# Patient Record
Sex: Female | Born: 1990 | Hispanic: No | Marital: Single | Smoking: Never smoker
Health system: Southern US, Community
[De-identification: ages and names within clinical notes are randomized; demographics above are authoritative.]

## PROBLEM LIST (undated history)

## (undated) DIAGNOSIS — K649 Unspecified hemorrhoids: Secondary | ICD-10-CM

## (undated) DIAGNOSIS — J189 Pneumonia, unspecified organism: Secondary | ICD-10-CM

## (undated) DIAGNOSIS — R011 Cardiac murmur, unspecified: Secondary | ICD-10-CM

## (undated) DIAGNOSIS — J45909 Unspecified asthma, uncomplicated: Secondary | ICD-10-CM

---

## 2008-09-16 ENCOUNTER — Emergency Department (HOSPITAL_COMMUNITY): Admission: EM | Admit: 2008-09-16 | Discharge: 2008-09-16 | Payer: Self-pay | Admitting: Family Medicine

## 2008-09-16 ENCOUNTER — Encounter (INDEPENDENT_AMBULATORY_CARE_PROVIDER_SITE_OTHER): Payer: Self-pay | Admitting: *Deleted

## 2009-04-02 ENCOUNTER — Emergency Department (HOSPITAL_COMMUNITY): Admission: EM | Admit: 2009-04-02 | Discharge: 2009-04-02 | Payer: Self-pay | Admitting: Emergency Medicine

## 2009-05-31 ENCOUNTER — Emergency Department (HOSPITAL_COMMUNITY): Admission: EM | Admit: 2009-05-31 | Discharge: 2009-05-31 | Payer: Self-pay | Admitting: Emergency Medicine

## 2010-05-02 LAB — POCT URINALYSIS DIP (DEVICE)
Bilirubin Urine: NEGATIVE
Glucose, UA: NEGATIVE mg/dL
Nitrite: NEGATIVE

## 2010-05-02 LAB — POCT PREGNANCY, URINE: Preg Test, Ur: NEGATIVE

## 2010-06-27 ENCOUNTER — Emergency Department (HOSPITAL_COMMUNITY)
Admission: EM | Admit: 2010-06-27 | Discharge: 2010-06-27 | Disposition: A | Discharge status: Home / Self Care | Payer: PRIVATE HEALTH INSURANCE | Attending: Emergency Medicine | Admitting: Emergency Medicine

## 2010-06-27 DIAGNOSIS — IMO0002 Reserved for concepts with insufficient information to code with codable children: Secondary | ICD-10-CM | POA: Insufficient documentation

## 2010-06-27 DIAGNOSIS — R011 Cardiac murmur, unspecified: Secondary | ICD-10-CM | POA: Insufficient documentation

## 2010-06-27 DIAGNOSIS — Y9229 Other specified public building as the place of occurrence of the external cause: Secondary | ICD-10-CM | POA: Insufficient documentation

## 2010-06-27 DIAGNOSIS — J45909 Unspecified asthma, uncomplicated: Secondary | ICD-10-CM | POA: Insufficient documentation

## 2010-06-27 DIAGNOSIS — S0100XA Unspecified open wound of scalp, initial encounter: Secondary | ICD-10-CM | POA: Insufficient documentation

## 2018-05-26 HISTORY — PX: LIPOSUCTION: SHX10

## 2019-11-15 ENCOUNTER — Other Ambulatory Visit: Payer: Self-pay | Admitting: Obstetrics and Gynecology

## 2019-11-16 ENCOUNTER — Encounter (HOSPITAL_COMMUNITY): Payer: Self-pay

## 2019-11-16 ENCOUNTER — Encounter (HOSPITAL_COMMUNITY)
Admission: RE | Admit: 2019-11-16 | Discharge: 2019-11-16 | Disposition: A | Discharge status: Home / Self Care | Payer: BC Managed Care – PPO | Source: Ambulatory Visit | Attending: Obstetrics and Gynecology | Admitting: Obstetrics and Gynecology

## 2019-11-16 ENCOUNTER — Other Ambulatory Visit: Payer: Self-pay

## 2019-11-16 ENCOUNTER — Encounter (HOSPITAL_COMMUNITY): Payer: Self-pay | Admitting: Obstetrics and Gynecology

## 2019-11-16 DIAGNOSIS — D509 Iron deficiency anemia, unspecified: Secondary | ICD-10-CM

## 2019-11-16 DIAGNOSIS — Z01812 Encounter for preprocedural laboratory examination: Secondary | ICD-10-CM | POA: Insufficient documentation

## 2019-11-16 DIAGNOSIS — D259 Leiomyoma of uterus, unspecified: Secondary | ICD-10-CM

## 2019-11-16 DIAGNOSIS — N1330 Unspecified hydronephrosis: Secondary | ICD-10-CM

## 2019-11-16 HISTORY — DX: Leiomyoma of uterus, unspecified: D25.9

## 2019-11-16 HISTORY — DX: Cardiac murmur, unspecified: R01.1

## 2019-11-16 HISTORY — DX: Unspecified hemorrhoids: K64.9

## 2019-11-16 HISTORY — DX: Pneumonia, unspecified organism: J18.9

## 2019-11-16 HISTORY — DX: Iron deficiency anemia, unspecified: D50.9

## 2019-11-16 HISTORY — DX: Unspecified asthma, uncomplicated: J45.909

## 2019-11-16 HISTORY — DX: Unspecified hydronephrosis: N13.30

## 2019-11-16 LAB — CBC
HCT: 37.8 % (ref 36.0–46.0)
Hemoglobin: 11.9 g/dL — ABNORMAL LOW (ref 12.0–15.0)
MCH: 27 pg (ref 26.0–34.0)
MCHC: 31.5 g/dL (ref 30.0–36.0)
MCV: 85.9 fL (ref 80.0–100.0)
Platelets: 348 10*3/uL (ref 150–400)
RBC: 4.4 MIL/uL (ref 3.87–5.11)
RDW: 16.5 % — ABNORMAL HIGH (ref 11.5–15.5)
WBC: 3.2 10*3/uL — ABNORMAL LOW (ref 4.0–10.5)
nRBC: 0 % (ref 0.0–0.2)

## 2019-11-16 LAB — TYPE AND SCREEN
ABO/RH(D): O POS
Antibody Screen: NEGATIVE

## 2019-11-16 NOTE — Pre-Procedure Instructions (Signed)
Dezirea Hawkins  11/16/2019    Your procedure is scheduled on Friday, November 23, 2019 at 1:30 PM.   Report to [facility] Entrance "A" Admitting Office at 11:30 AM.   Call this number if you have problems the morning of surgery: [phone]   Questions prior to day of surgery, please call [phone] between 8 & 4 PM.   Remember:  Do not eat food after midnight Thursday, 11/22/19.  You may drink clear liquids until 10:30 AM.  Clear liquids allowed are:  Water, Juice (non-citric and without pulp - diabetics please choose diet or no sugar options), Carbonated beverages - (diabetics please choose diet or no sugar options), Clear Tea, Black Coffee only (no creamer, milk or cream including half and half), Plain Jell-O only (diabetics please choose diet or no sugar options) and Gatorade (diabetics please choose diet or no sugar options)    Take these medicines the morning of surgery with A SIP OF WATER: Levocetirizine (Xyzal) - if needed, Albuterol inhaler (Ventolin) - if needed (bring inhaler with you day of surgery)  Stop Vitamins as of today prior to surgery. Do not use Aspirin containing products, NSAIDS (Ibuprofen, Aleve, etc), Herbal medications or Fish Oil prior to surgery.    Do not wear jewelry, make-up or nail polish.  Do not wear lotions, powders, perfumes or deodorant.  Do not shave 48 hours prior to surgery.    Do not bring valuables to the hospital.  [facility] is not responsible for any belongings or valuables.  Contacts, dentures or bridgework may not be worn into surgery.  Leave your suitcase in the car.  After surgery it may be brought to your room.  For patients admitted to the hospital, discharge time will be determined by your treatment team.  [facility] - Preparing for Surgery  Before surgery, you can play an important role.  Because skin is not sterile, your skin needs to be as free of germs as possible.  You can reduce the number of germs on you skin  by washing with CHG (chlorahexidine gluconate) soap before surgery.  CHG is an antiseptic cleaner which kills germs and bonds with the skin to continue killing germs even after washing.  Oral Hygiene is also important in reducing the risk of infection.  Remember to brush your teeth with your regular toothpaste the morning of surgery.  Please DO NOT use if you have an allergy to CHG or antibacterial soaps.  If your skin becomes reddened/irritated stop using the CHG and inform your nurse when you arrive at Short Stay.  Do not shave (including legs and underarms) for at least 48 hours prior to the first CHG shower.  You may shave your face.  Please follow these instructions carefully:   1.  Shower with CHG Soap the night before surgery and the morning of Surgery.  2.  If you choose to wash your hair, wash your hair first as usual with your normal shampoo.  3.  After you shampoo, rinse your hair and body thoroughly to remove the shampoo. 4.  Use CHG as you would any other liquid soap.  You can apply chg directly to the skin and wash gently with a      scrungie or washcloth.           5.  Apply the CHG Soap to your body ONLY FROM THE NECK DOWN.   Do not use on open wounds or open sores. Avoid contact with your eyes, ears, mouth  and genitals (private parts).  Wash genitals (private parts) with your normal soap - do this prior to using the CHG soap.  6.  Wash thoroughly, paying special attention to the area where your surgery will be performed.  7.  Thoroughly rinse your body with warm water from the neck down.  8.  DO NOT shower/wash with your normal soap after using and rinsing off the CHG Soap.  9.  Pat yourself dry with a clean towel.            10.  Wear clean pajamas.            11.  Place clean sheets on your bed the night of your first shower and do not sleep with pets.  Day of Surgery  Shower as above. Do not apply any lotions/deodorants the morning of surgery.   Please wear clean clothes  to the hospital. Remember to brush your teeth with toothpaste.  Please read over the fact sheets that you were given.

## 2019-11-16 NOTE — H&P (Addendum)
Alicia Hawkins is an 29 y.o. female. G0 BF presents for exp lap, myomectomy due to symptomatic uterine fibroids. Pt was found to have hydronephrosis on sonogram along with large uterine fibroids. She complains of heavy cycles. (+) IDA  Pertinent Gynecological History: Menses: flow is excessive with use of 5 pads or tampons on heaviest days Bleeding: menorrhagia Contraception: condoms DES exposure: denies Blood transfusions: none Sexually transmitted diseases: none Previous GYN Procedures: liposuction  Last mammogram: n/a Date:n/a Last pap: normal Date:2021 OB History: G0   Menstrual History: Menarche age: n/a No LMP recorded.    PMHx: IDA Uterine fibroid  Surgical hx;  liposuction  FH: noncontributory  Social History: single, nonsmoker, employed  Allergies: No Known Allergies  No latex allergy  No medications prior to admission.    Review of Systems  All other systems reviewed and are negative.   There were no vitals taken for this visit. Physical Exam Constitutional:      Appearance: Normal appearance.  HENT:     Head: Normocephalic.     Mouth/Throat:     Pharynx: Oropharynx is clear.  Eyes:     Extraocular Movements: Extraocular movements intact.  Cardiovascular:     Rate and Rhythm: Normal rate.  Pulmonary:     Breath sounds: Normal breath sounds.  Abdominal:     Palpations: Abdomen is soft.     Tenderness: There is no right CVA tenderness or left CVA tenderness.    Genitourinary:    General: Normal vulva.     Cervix: Normal.     Comments: Uterus 16 wk size with palp left mobile large 10 cm fibroid Musculoskeletal:        General: Normal range of motion.     Cervical back: Neck supple.  Skin:    General: Skin is dry.  Neurological:     Mental Status: She is alert.  Psychiatric:        Mood and Affect: Mood normal.        Behavior: Behavior normal.     No results found for this or any previous visit (from the past 24 hour(s)).  No  results found.  Assessment/Plan: Uterine fibroids IDA Hydronephrosis on sonogram P) exp lap, myomectomy. Risk of surgery reviewed including infection, bleeding, poss need for blood transfusion and its risk( HIV, acute rxn, hepatitis), internal scar tissue, injury to surrounding organ structures, poss need for C/S in the future, up to 30% chance of needing same procedure in the future. All ? answered  Derrico Zhong A [name] 11/16/2019, 6:43 AM   Addendum 10/29 @ 1:35 pm;: I have reexamined pt. No change since last visit. Currently spotting from cycle

## 2019-11-16 NOTE — Progress Notes (Signed)
PCP - Virginia Rochester, PA   ERAS Protcol - no drink   COVID TEST- scheduled for 11/21/19   Anesthesia review: No  Patient denies shortness of breath, fever, cough and chest pain at PAT appointment   All instructions explained to the patient, with a verbal understanding of the material. Patient agrees to go over the instructions while at home for a better understanding. Patient also instructed to self quarantine after being tested for COVID-19. The opportunity to ask questions was provided.

## 2019-11-21 ENCOUNTER — Other Ambulatory Visit (HOSPITAL_COMMUNITY)
Admission: RE | Admit: 2019-11-21 | Discharge: 2019-11-21 | Disposition: A | Discharge status: Home / Self Care | Payer: BC Managed Care – PPO | Source: Ambulatory Visit | Attending: Obstetrics and Gynecology | Admitting: Obstetrics and Gynecology

## 2019-11-21 DIAGNOSIS — Z01818 Encounter for other preprocedural examination: Secondary | ICD-10-CM | POA: Insufficient documentation

## 2019-11-21 DIAGNOSIS — Z20822 Contact with and (suspected) exposure to covid-19: Secondary | ICD-10-CM | POA: Insufficient documentation

## 2019-11-21 LAB — SARS CORONAVIRUS 2 (TAT 6-24 HRS): SARS Coronavirus 2: NEGATIVE

## 2019-11-23 ENCOUNTER — Encounter (HOSPITAL_COMMUNITY): Payer: Self-pay | Admitting: Obstetrics and Gynecology

## 2019-11-23 ENCOUNTER — Encounter (HOSPITAL_COMMUNITY)
Admission: RE | Disposition: A | Discharge status: Home / Self Care | Payer: Self-pay | Source: Home / Self Care | Attending: Obstetrics and Gynecology

## 2019-11-23 ENCOUNTER — Inpatient Hospital Stay (HOSPITAL_COMMUNITY)
Admission: RE | Admit: 2019-11-23 | Discharge: 2019-11-26 | DRG: 742 | Disposition: A | Discharge status: Home / Self Care | Payer: BC Managed Care – PPO | Attending: Obstetrics and Gynecology | Admitting: Obstetrics and Gynecology

## 2019-11-23 ENCOUNTER — Other Ambulatory Visit: Payer: Self-pay

## 2019-11-23 ENCOUNTER — Inpatient Hospital Stay (HOSPITAL_COMMUNITY): Payer: BC Managed Care – PPO | Admitting: Anesthesiology

## 2019-11-23 ENCOUNTER — Inpatient Hospital Stay (HOSPITAL_COMMUNITY): Payer: BC Managed Care – PPO | Admitting: Vascular Surgery

## 2019-11-23 DIAGNOSIS — D509 Iron deficiency anemia, unspecified: Secondary | ICD-10-CM | POA: Diagnosis present

## 2019-11-23 DIAGNOSIS — Z20822 Contact with and (suspected) exposure to covid-19: Secondary | ICD-10-CM | POA: Diagnosis present

## 2019-11-23 DIAGNOSIS — D219 Benign neoplasm of connective and other soft tissue, unspecified: Secondary | ICD-10-CM | POA: Diagnosis present

## 2019-11-23 DIAGNOSIS — D259 Leiomyoma of uterus, unspecified: Secondary | ICD-10-CM | POA: Diagnosis present

## 2019-11-23 DIAGNOSIS — Z9889 Other specified postprocedural states: Secondary | ICD-10-CM

## 2019-11-23 DIAGNOSIS — N1330 Unspecified hydronephrosis: Secondary | ICD-10-CM | POA: Diagnosis present

## 2019-11-23 HISTORY — PX: MYOMECTOMY: SHX85

## 2019-11-23 LAB — POCT PREGNANCY, URINE: Preg Test, Ur: NEGATIVE

## 2019-11-23 LAB — ABO/RH: ABO/RH(D): O POS

## 2019-11-23 SURGERY — MYOMECTOMY, ABDOMINAL APPROACH
Anesthesia: General

## 2019-11-23 MED ORDER — SIMETHICONE 80 MG PO CHEW: 80.0000 mg | CHEWABLE_TABLET | Freq: Four times a day (QID) | ORAL | Status: DC | PRN

## 2019-11-23 MED ORDER — VASOPRESSIN 20 UNIT/ML IV SOLN
INTRAVENOUS | Status: AC
  Filled 2019-11-23: qty 3

## 2019-11-23 MED ORDER — DIPHENHYDRAMINE HCL 50 MG/ML IJ SOLN: 12.5000 mg | Freq: Four times a day (QID) | INTRAMUSCULAR | Status: DC | PRN

## 2019-11-23 MED ORDER — ACETAMINOPHEN 10 MG/ML IV SOLN
INTRAVENOUS | Status: AC
  Filled 2019-11-23: qty 100

## 2019-11-23 MED ORDER — PANTOPRAZOLE SODIUM 40 MG PO TBEC
40.0000 mg | DELAYED_RELEASE_TABLET | Freq: Every day | ORAL | Status: DC
  Administered 2019-11-24 – 2019-11-26 (×3): 40 mg via ORAL
  Filled 2019-11-23 (×3): qty 1

## 2019-11-23 MED ORDER — ACETAMINOPHEN 10 MG/ML IV SOLN
INTRAVENOUS | Status: DC | PRN
  Administered 2019-11-23: 1000 mg via INTRAVENOUS

## 2019-11-23 MED ORDER — ALBUTEROL SULFATE HFA 108 (90 BASE) MCG/ACT IN AERS
2.0000 | INHALATION_SPRAY | Freq: Four times a day (QID) | RESPIRATORY_TRACT | Status: DC | PRN
  Filled 2019-11-23: qty 6.7

## 2019-11-23 MED ORDER — DEXAMETHASONE SODIUM PHOSPHATE 10 MG/ML IJ SOLN
INTRAMUSCULAR | Status: DC | PRN
  Administered 2019-11-23: 10 mg via INTRAVENOUS

## 2019-11-23 MED ORDER — VASOPRESSIN 20 UNIT/ML IV SOLN
INTRAVENOUS | Status: DC | PRN
  Administered 2019-11-23: 44 mL via INTRAMUSCULAR
  Administered 2019-11-23: 10 mL via INTRAMUSCULAR

## 2019-11-23 MED ORDER — ONDANSETRON HCL 4 MG/2ML IJ SOLN
INTRAMUSCULAR | Status: DC | PRN
  Administered 2019-11-23: 4 mg via INTRAVENOUS

## 2019-11-23 MED ORDER — POVIDONE-IODINE 10 % EX SWAB: 2.0000 "application " | Freq: Once | CUTANEOUS | Status: DC

## 2019-11-23 MED ORDER — OXYCODONE HCL 5 MG PO TABS: 5.0000 mg | ORAL_TABLET | Freq: Once | ORAL | Status: DC | PRN

## 2019-11-23 MED ORDER — FENTANYL CITRATE (PF) 250 MCG/5ML IJ SOLN
INTRAMUSCULAR | Status: DC | PRN
  Administered 2019-11-23: 50 ug via INTRAVENOUS
  Administered 2019-11-23: 100 ug via INTRAVENOUS
  Administered 2019-11-23: 50 ug via INTRAVENOUS

## 2019-11-23 MED ORDER — BUPIVACAINE HCL (PF) 0.25 % IJ SOLN
INTRAMUSCULAR | Status: AC
  Filled 2019-11-23: qty 30

## 2019-11-23 MED ORDER — SENNOSIDES-DOCUSATE SODIUM 8.6-50 MG PO TABS
1.0000 | ORAL_TABLET | Freq: Every evening | ORAL | Status: DC | PRN
  Administered 2019-11-24: 1 via ORAL
  Filled 2019-11-23: qty 1

## 2019-11-23 MED ORDER — OXYCODONE HCL 5 MG/5ML PO SOLN: 5.0000 mg | Freq: Once | ORAL | Status: DC | PRN

## 2019-11-23 MED ORDER — HYDROMORPHONE 1 MG/ML IV SOLN
INTRAVENOUS | Status: DC
  Administered 2019-11-23: 0.9 mg via INTRAVENOUS
  Administered 2019-11-23: 2.2 mg via INTRAVENOUS
  Administered 2019-11-23: 30 mg via INTRAVENOUS
  Administered 2019-11-24: 2.2 mg via INTRAVENOUS

## 2019-11-23 MED ORDER — OXYCODONE HCL 5 MG PO TABS
5.0000 mg | ORAL_TABLET | ORAL | Status: DC | PRN
  Administered 2019-11-24: 10 mg via ORAL
  Administered 2019-11-24: 5 mg via ORAL
  Administered 2019-11-24 – 2019-11-26 (×8): 10 mg via ORAL
  Filled 2019-11-23: qty 1
  Filled 2019-11-23 (×2): qty 2
  Filled 2019-11-23: qty 1
  Filled 2019-11-23 (×7): qty 2

## 2019-11-23 MED ORDER — SODIUM CHLORIDE 0.9% FLUSH: 9.0000 mL | INTRAVENOUS | Status: DC | PRN

## 2019-11-23 MED ORDER — SUGAMMADEX SODIUM 200 MG/2ML IV SOLN
INTRAVENOUS | Status: DC | PRN
  Administered 2019-11-23: 120 mg via INTRAVENOUS

## 2019-11-23 MED ORDER — DEXTROSE IN LACTATED RINGERS 5 % IV SOLN: INTRAVENOUS | Status: DC

## 2019-11-23 MED ORDER — KETOROLAC TROMETHAMINE 30 MG/ML IJ SOLN
30.0000 mg | Freq: Once | INTRAMUSCULAR | Status: AC | PRN
  Administered 2019-11-23: 30 mg via INTRAVENOUS

## 2019-11-23 MED ORDER — KETOROLAC TROMETHAMINE 30 MG/ML IJ SOLN
INTRAMUSCULAR | Status: AC
  Filled 2019-11-23: qty 1

## 2019-11-23 MED ORDER — DIPHENHYDRAMINE HCL 12.5 MG/5ML PO ELIX: 12.5000 mg | ORAL_SOLUTION | Freq: Four times a day (QID) | ORAL | Status: DC | PRN

## 2019-11-23 MED ORDER — CHLORHEXIDINE GLUCONATE 0.12 % MT SOLN
OROMUCOSAL | Status: AC
  Administered 2019-11-23: 15 mL via OROMUCOSAL
  Filled 2019-11-23: qty 15

## 2019-11-23 MED ORDER — BUPIVACAINE HCL (PF) 0.25 % IJ SOLN
INTRAMUSCULAR | Status: DC | PRN
  Administered 2019-11-23: 9 mL

## 2019-11-23 MED ORDER — ONDANSETRON HCL 4 MG PO TABS: 4.0000 mg | ORAL_TABLET | Freq: Four times a day (QID) | ORAL | Status: DC | PRN

## 2019-11-23 MED ORDER — CEFAZOLIN SODIUM-DEXTROSE 2-4 GM/100ML-% IV SOLN
2.0000 g | INTRAVENOUS | Status: AC
  Administered 2019-11-23: 2 g via INTRAVENOUS

## 2019-11-23 MED ORDER — MIDAZOLAM HCL 2 MG/2ML IJ SOLN
INTRAMUSCULAR | Status: DC | PRN
  Administered 2019-11-23: 2 mg via INTRAVENOUS

## 2019-11-23 MED ORDER — ONDANSETRON HCL 4 MG/2ML IJ SOLN
4.0000 mg | Freq: Four times a day (QID) | INTRAMUSCULAR | Status: DC | PRN
  Administered 2019-11-25: 4 mg via INTRAVENOUS
  Filled 2019-11-23 (×2): qty 2

## 2019-11-23 MED ORDER — MENTHOL 3 MG MT LOZG: 1.0000 | LOZENGE | OROMUCOSAL | Status: DC | PRN

## 2019-11-23 MED ORDER — HYDROMORPHONE 1 MG/ML IV SOLN
INTRAVENOUS | Status: AC
  Filled 2019-11-23: qty 30

## 2019-11-23 MED ORDER — MIDAZOLAM HCL 2 MG/2ML IJ SOLN
INTRAMUSCULAR | Status: AC
  Filled 2019-11-23: qty 2

## 2019-11-23 MED ORDER — SCOPOLAMINE 1 MG/3DAYS TD PT72
MEDICATED_PATCH | TRANSDERMAL | Status: DC | PRN
  Administered 2019-11-23: 1 via TRANSDERMAL

## 2019-11-23 MED ORDER — NALOXONE HCL 0.4 MG/ML IJ SOLN: 0.4000 mg | INTRAMUSCULAR | Status: DC | PRN

## 2019-11-23 MED ORDER — PROMETHAZINE HCL 25 MG/ML IJ SOLN: 6.2500 mg | INTRAMUSCULAR | Status: DC | PRN

## 2019-11-23 MED ORDER — FENTANYL CITRATE (PF) 250 MCG/5ML IJ SOLN
INTRAMUSCULAR | Status: AC
  Filled 2019-11-23: qty 5

## 2019-11-23 MED ORDER — SODIUM CHLORIDE 0.9 % IR SOLN
Status: DC | PRN
  Administered 2019-11-23: 2000 mL

## 2019-11-23 MED ORDER — HYDROMORPHONE HCL 1 MG/ML IJ SOLN: 0.2500 mg | INTRAMUSCULAR | Status: DC | PRN

## 2019-11-23 MED ORDER — ROCURONIUM BROMIDE 10 MG/ML (PF) SYRINGE
PREFILLED_SYRINGE | INTRAVENOUS | Status: DC | PRN
  Administered 2019-11-23: 70 mg via INTRAVENOUS
  Administered 2019-11-23 (×2): 20 mg via INTRAVENOUS

## 2019-11-23 MED ORDER — LIDOCAINE 2% (20 MG/ML) 5 ML SYRINGE
INTRAMUSCULAR | Status: DC | PRN
  Administered 2019-11-23: 100 mg via INTRAVENOUS

## 2019-11-23 MED ORDER — ALBUMIN HUMAN 5 % IV SOLN: INTRAVENOUS | Status: DC | PRN

## 2019-11-23 MED ORDER — PHENYLEPHRINE 40 MCG/ML (10ML) SYRINGE FOR IV PUSH (FOR BLOOD PRESSURE SUPPORT)
PREFILLED_SYRINGE | INTRAVENOUS | Status: DC | PRN
  Administered 2019-11-23: 80 ug via INTRAVENOUS
  Administered 2019-11-23: 120 ug via INTRAVENOUS
  Administered 2019-11-23: 80 ug via INTRAVENOUS

## 2019-11-23 MED ORDER — CEFAZOLIN SODIUM-DEXTROSE 2-4 GM/100ML-% IV SOLN
INTRAVENOUS | Status: AC
  Filled 2019-11-23: qty 100

## 2019-11-23 MED ORDER — ORAL CARE MOUTH RINSE: 15.0000 mL | Freq: Once | OROMUCOSAL | Status: AC

## 2019-11-23 MED ORDER — PROPOFOL 10 MG/ML IV BOLUS
INTRAVENOUS | Status: DC | PRN
  Administered 2019-11-23: 150 mg via INTRAVENOUS

## 2019-11-23 MED ORDER — CHLORHEXIDINE GLUCONATE 0.12 % MT SOLN: 15.0000 mL | Freq: Once | OROMUCOSAL | Status: AC

## 2019-11-23 MED ORDER — ONDANSETRON HCL 4 MG/2ML IJ SOLN
4.0000 mg | Freq: Four times a day (QID) | INTRAMUSCULAR | Status: DC | PRN
  Administered 2019-11-24: 4 mg via INTRAVENOUS

## 2019-11-23 MED ORDER — LACTATED RINGERS IV SOLN: INTRAVENOUS | Status: DC

## 2019-11-23 SURGICAL SUPPLY — 48 items
BARRIER ADHS 3X4 INTERCEED (GAUZE/BANDAGES/DRESSINGS) ×9 IMPLANT
BENZOIN TINCTURE PRP APPL 2/3 (GAUZE/BANDAGES/DRESSINGS) ×3 IMPLANT
CANISTER SUCT 3000ML PPV (MISCELLANEOUS) ×3 IMPLANT
CONT SPEC PATH 64OZ SNAP LID (MISCELLANEOUS) ×3 IMPLANT
DECANTER SPIKE VIAL GLASS SM (MISCELLANEOUS) ×3 IMPLANT
DRAPE CESAREAN BIRTH W POUCH (DRAPES) ×3 IMPLANT
DRSG OPSITE POSTOP 4X10 (GAUZE/BANDAGES/DRESSINGS) ×3 IMPLANT
DURAPREP 26ML APPLICATOR (WOUND CARE) ×3 IMPLANT
ELECT NEEDLE TIP 2.8 STRL (NEEDLE) ×3 IMPLANT
GAUZE 4X4 16PLY RFD (DISPOSABLE) IMPLANT
GAUZE SPONGE 4X4 12PLY STRL LF (GAUZE/BANDAGES/DRESSINGS) ×3 IMPLANT
GLOVE BIOGEL PI IND STRL 7.0 (GLOVE) ×3 IMPLANT
GLOVE BIOGEL PI INDICATOR 7.0 (GLOVE) ×6
GLOVE ECLIPSE 6.5 STRL STRAW (GLOVE) ×3 IMPLANT
GOWN STRL REUS W/ TWL LRG LVL3 (GOWN DISPOSABLE) ×3 IMPLANT
GOWN STRL REUS W/TWL LRG LVL3 (GOWN DISPOSABLE) ×6
KIT TURNOVER KIT B (KITS) ×3 IMPLANT
NEEDLE HYPO 22GX1.5 SAFETY (NEEDLE) ×3 IMPLANT
NEEDLE SPNL 22GX3.5 QUINCKE BK (NEEDLE) ×3 IMPLANT
NS IRRIG 1000ML POUR BTL (IV SOLUTION) ×3 IMPLANT
PACK ABDOMINAL GYN (CUSTOM PROCEDURE TRAY) ×3 IMPLANT
PAD ARMBOARD 7.5X6 YLW CONV (MISCELLANEOUS) ×3 IMPLANT
PAD OB MATERNITY 4.3X12.25 (PERSONAL CARE ITEMS) ×3 IMPLANT
PENCIL SMOKE EVACUATOR (MISCELLANEOUS) ×3 IMPLANT
RTRCTR C-SECT PINK 25CM LRG (MISCELLANEOUS) ×3 IMPLANT
SPECIMEN JAR MEDIUM (MISCELLANEOUS) ×3 IMPLANT
SPONGE LAP 18X18 X RAY DECT (DISPOSABLE) ×18 IMPLANT
STAPLER VISISTAT 35W (STAPLE) IMPLANT
SUT MON AB 2-0 SH 27 (SUTURE) ×2
SUT MON AB 2-0 SH27 (SUTURE) ×1 IMPLANT
SUT MON AB 3-0 SH 27 (SUTURE) ×6
SUT MON AB 3-0 SH27 (SUTURE) ×3 IMPLANT
SUT MON AB 4-0 PS1 27 (SUTURE) ×3 IMPLANT
SUT PLAIN 2 0 XLH (SUTURE) IMPLANT
SUT VIC AB 0 CT1 18XCR BRD 8 (SUTURE) ×3 IMPLANT
SUT VIC AB 0 CT1 18XCR BRD8 (SUTURE) ×1 IMPLANT
SUT VIC AB 0 CT1 36 (SUTURE) ×6 IMPLANT
SUT VIC AB 0 CT1 8-18 (SUTURE) ×8
SUT VIC AB 2-0 CT1 27 (SUTURE) ×2
SUT VIC AB 2-0 CT1 TAPERPNT 27 (SUTURE) ×1 IMPLANT
SUT VIC AB 2-0 SH 27 (SUTURE)
SUT VIC AB 2-0 SH 27XBRD (SUTURE) IMPLANT
SUT VIC AB 3-0 SH 8-18 (SUTURE) ×3 IMPLANT
SUT VICRYL 4-0 PS2 18IN ABS (SUTURE) IMPLANT
SYR CONTROL 10ML LL (SYRINGE) ×6 IMPLANT
TAPE STRIPS DRAPE STRL (GAUZE/BANDAGES/DRESSINGS) ×3 IMPLANT
TOWEL GREEN STERILE FF (TOWEL DISPOSABLE) ×6 IMPLANT
TRAY FOLEY W/BAG SLVR 14FR (SET/KITS/TRAYS/PACK) ×3 IMPLANT

## 2019-11-23 NOTE — Anesthesia Postprocedure Evaluation (Signed)
Anesthesia Post Note  Patient: Alicia Hawkins  Procedure(s) Performed: ABDOMINAL MYOMECTOMY (N/A )     Patient location during evaluation: PACU Anesthesia Type: General Level of consciousness: awake Pain management: pain level controlled Vital Signs Assessment: post-procedure vital signs reviewed and stable Respiratory status: spontaneous breathing, nonlabored ventilation, respiratory function stable and patient connected to nasal cannula oxygen Cardiovascular status: blood pressure returned to baseline and stable Postop Assessment: no apparent nausea or vomiting Anesthetic complications: yes   Encounter Complications  Complication Outcome Phase Comment  Vomiting  Intraprocedure patient vomited after extubation, immediately placed on side and suuctioned.    Last Vitals:  Vitals:   11/23/19 1851 11/23/19 1913  BP: 110/63   Pulse: 84   Resp: 18 17  Temp: 36.6 C   SpO2:  97%    Last Pain:  Vitals:   11/23/19 1913  TempSrc:   PainSc: 3                  Daegon Deiss P Katalena Malveaux

## 2019-11-23 NOTE — Anesthesia Preprocedure Evaluation (Signed)
Anesthesia Evaluation  Patient identified by MRN, date of birth, ID band Patient awake    Reviewed: Allergy & Precautions, NPO status , Patient's Chart, lab work & pertinent test results  Airway Mallampati: II  TM Distance: >3 FB Neck ROM: Full    Dental no notable dental hx.    Pulmonary asthma ,    Pulmonary exam normal breath sounds clear to auscultation       Cardiovascular negative cardio ROS Normal cardiovascular exam Rhythm:Regular Rate:Normal     Neuro/Psych negative neurological ROS  negative psych ROS   GI/Hepatic negative GI ROS, Neg liver ROS,   Endo/Other  negative endocrine ROS  Renal/GU negative Renal ROS  negative genitourinary   Musculoskeletal negative musculoskeletal ROS (+)   Abdominal   Peds negative pediatric ROS (+)  Hematology  (+) anemia ,   Anesthesia Other Findings   Reproductive/Obstetrics negative OB ROS                             Anesthesia Physical Anesthesia Plan  ASA: II  Anesthesia Plan: General   Post-op Pain Management:    Induction: Intravenous  PONV Risk Score and Plan: 4 or greater and Ondansetron, Dexamethasone, Treatment may vary due to age or medical condition, Scopolamine patch - Pre-op and Midazolam  Airway Management Planned: Oral ETT  Additional Equipment:   Intra-op Plan:   Post-operative Plan: Extubation in OR  Informed Consent: I have reviewed the patients History and Physical, chart, labs and discussed the procedure including the risks, benefits and alternatives for the proposed anesthesia with the patient or authorized representative who has indicated his/her understanding and acceptance.     Dental advisory given  Plan Discussed with: CRNA and Surgeon  Anesthesia Plan Comments:         Anesthesia Quick Evaluation

## 2019-11-23 NOTE — Progress Notes (Signed)
Notified Dr. Kalman Shan of pt's piercing in the right ear. Pt. Cannot get it out.

## 2019-11-23 NOTE — Anesthesia Procedure Notes (Signed)
Procedure Name: Intubation Date/Time: 11/23/2019 1:54 PM Performed by: Bryson Corona, CRNA Pre-anesthesia Checklist: Patient identified, Emergency Drugs available, Suction available and Patient being monitored Patient Re-evaluated:Patient Re-evaluated prior to induction Oxygen Delivery Method: Circle System Utilized Preoxygenation: Pre-oxygenation with 100% oxygen Induction Type: IV induction Ventilation: Mask ventilation without difficulty Laryngoscope Size: Mac and 3 Grade View: Grade I Tube type: Oral Tube size: 7.0 mm Number of attempts: 1 Airway Equipment and Method: Stylet Placement Confirmation: ETT inserted through vocal cords under direct vision,  positive ETCO2 and breath sounds checked- equal and bilateral Secured at: 21 cm Tube secured with: Tape Dental Injury: Teeth and Oropharynx as per pre-operative assessment

## 2019-11-23 NOTE — Transfer of Care (Signed)
Immediate Anesthesia Transfer of Care Note  Patient: Alicia Hawkins  Procedure(s) Performed: ABDOMINAL MYOMECTOMY (N/A )  Patient Location: PACU  Anesthesia Type:General  Level of Consciousness: lethargic and responds to stimulation  Airway & Oxygen Therapy: Patient Spontanous Breathing  Post-op Assessment: Report given to RN  Post vital signs: Reviewed and stable  Last Vitals:  Vitals Value Taken Time  BP 109/74 11/23/19 1717  Temp    Pulse 92 11/23/19 1719  Resp 22 11/23/19 1719  SpO2 100 % 11/23/19 1719  Vitals shown include unvalidated device data.  Last Pain:  Vitals:   11/23/19 1230  TempSrc:   PainSc: 0-No pain      Patients Stated Pain Goal: 2 (67/67/20 9470)  Complications: No complications documented.

## 2019-11-23 NOTE — Brief Op Note (Signed)
11/23/2019  5:17 PM  PATIENT:  Alicia Hawkins  29 y.o. female  PRE-OPERATIVE DIAGNOSIS:  symptomatic  uterine fibroid( menorrhagia, IDA, IM/SS fibroid)  POST-OPERATIVE DIAGNOSIS:  IDA, menorrhagia, IM/SS/SM fibroids  PROCEDURE:  exp lap, multiple myomectomy  SURGEON:  Surgeon(s) and Role:    * Derica Leiber, Alanda Slim, MD - Primary  PHYSICIAN ASSISTANT:   ASSISTANTS: Gaylord Shih RNFA   ANESTHESIA:   general Findings: multiple SM fibroids, large anterior pedunculated fibroid, left anterior IM/SS fibroid, posterior fundal fibroid SS x 3 notes, multiple intramural fibroids, nl tubes and ovaries, focal posterior cul de sac nodular endometriotic implant, nl ovaries, nl upper palp liver edge  EBL:  200 mL   BLOOD ADMINISTERED:none  DRAINS: none   LOCAL MEDICATIONS USED:  none  SPECIMEN:  Source of Specimen:  myoma x 18 ( 1186 g)  DISPOSITION OF SPECIMEN:  PATHOLOGY  COUNTS:  YES  TOURNIQUET:  * No tourniquets in log *  DICTATION: .Other Dictation: Dictation Number [id number]  PLAN OF CARE: Admit to inpatient   PATIENT DISPOSITION:  PACU - hemodynamically stable.   Delay start of Pharmacological VTE agent (>24hrs) due to surgical blood loss or risk of bleeding: no

## 2019-11-23 NOTE — OR Nursing (Addendum)
Patient left plastic spacer in belly button. Spacer came out prior to surgery. Patient also had earring in ear that she could not remove. Patient was made aware of possible injury however she was not able to remove. Earring was taped prior to going asleep.

## 2019-11-24 ENCOUNTER — Encounter (HOSPITAL_COMMUNITY): Payer: Self-pay | Admitting: Obstetrics and Gynecology

## 2019-11-24 LAB — CBC
HCT: 24.7 % — ABNORMAL LOW (ref 36.0–46.0)
Hemoglobin: 7.8 g/dL — ABNORMAL LOW (ref 12.0–15.0)
MCH: 26.6 pg (ref 26.0–34.0)
MCHC: 31.6 g/dL (ref 30.0–36.0)
MCV: 84.3 fL (ref 80.0–100.0)
Platelets: 303 10*3/uL (ref 150–400)
RBC: 2.93 MIL/uL — ABNORMAL LOW (ref 3.87–5.11)
RDW: 15.9 % — ABNORMAL HIGH (ref 11.5–15.5)
WBC: 12 10*3/uL — ABNORMAL HIGH (ref 4.0–10.5)
nRBC: 0 % (ref 0.0–0.2)

## 2019-11-24 MED ORDER — SODIUM CHLORIDE 0.9 % IV SOLN
25.0000 mg | Freq: Once | INTRAVENOUS | Status: AC
  Administered 2019-11-24: 25 mg via INTRAVENOUS
  Filled 2019-11-24: qty 0.5

## 2019-11-24 MED ORDER — SODIUM CHLORIDE 0.9 % IV SOLN
1000.0000 mg | Freq: Once | INTRAVENOUS | Status: AC
  Administered 2019-11-24: 1000 mg via INTRAVENOUS
  Filled 2019-11-24: qty 20

## 2019-11-24 MED ORDER — POLYSACCHARIDE IRON COMPLEX 150 MG PO CAPS
150.0000 mg | ORAL_CAPSULE | Freq: Every day | ORAL | Status: DC
  Administered 2019-11-24 – 2019-11-26 (×3): 150 mg via ORAL
  Filled 2019-11-24 (×3): qty 1

## 2019-11-24 NOTE — Progress Notes (Signed)
Pt stated she is not ready to get up yet. "It hurts even just laying here". Will try again later.

## 2019-11-24 NOTE — Progress Notes (Signed)
Foley catheter removed today.  Patient has voided multiple times with no complications.  Ambulating in halls with visitor.  PCA discontinued and patient is tolerating PO pain meds. Also tolerating liquids and solid foods.  Complaints of constipation.  PRN senna given.

## 2019-11-24 NOTE — Progress Notes (Addendum)
Pt was able to sit up on the side of the bed, got up and walked a few steps with a walker however, pt got nauseaus and vomited x 1. zofran given. Pain to surgical incision was 10/10 when she moves.  Pt went back to bed.

## 2019-11-24 NOTE — Progress Notes (Signed)
Test dos of [place] complete [id number].  VSS.  No complaints or complications noted. Will continue to monitor

## 2019-11-24 NOTE — Progress Notes (Signed)
Subjective: Patient reports nausea, vomiting and tolerating PO (liquids) Has not void as yet. C/o needing to urinate .  Emesis x 1 last night Had not gotten up as yet. Reviewed intraop findings with pt  Objective: I have reviewed patient's vital signs.  vital signs, intake and output and labs. Vitals:   11/24/19 0815 11/24/19 0820  BP: (!) 98/53   Pulse: 89   Resp:  18  Temp: 99.1 F (37.3 C)   SpO2:  99%   I/O last 3 completed shifts: In: 3450.4 [P.O.:315; I.V.:3010.4; Other:125] Out: 2060 [Urine:1860; Blood:200] No intake/output data recorded.  Lab Results  Component Value Date   WBC 12.0 (H) 11/24/2019   HGB 7.8 (L) 11/24/2019   HCT 24.7 (L) 11/24/2019   MCV 84.3 11/24/2019   PLT 303 11/24/2019   No results found for: CREATININE  EXAM General: alert, cooperative, NAD Resp: clear to auscultation bilaterally Cardio: regular rate and rhythm, S1, S2 normal, no murmur, click, rub or gallop GI: incision: dry, intact and brown stain on left and soft hypoactive BS nondistended Extremities: no edema, redness or tenderness in the calves or thighs Vaginal Bleeding: moderate  Assessment: s/p Procedure(s): POD #1 s/p exp lap ABDOMINAL MYOMECTOMY: stable and anemia IDA exacerbated by acute blood loss Plan: Advance diet Encourage ambulation Incentive spirometry, d/c PCA  IV iron infusion  start iron supplement  LOS: 1 day    Marvene Staff, MD 11/24/2019 10:34 AM    11/24/2019, 10:34 AM

## 2019-11-25 MED ORDER — ACETAMINOPHEN 500 MG PO TABS
1000.0000 mg | ORAL_TABLET | Freq: Once | ORAL | Status: AC
  Administered 2019-11-25: 1000 mg via ORAL
  Filled 2019-11-25: qty 2

## 2019-11-25 MED ORDER — DOCUSATE SODIUM 100 MG PO CAPS
100.0000 mg | ORAL_CAPSULE | Freq: Two times a day (BID) | ORAL | Status: DC
  Administered 2019-11-25 – 2019-11-26 (×3): 100 mg via ORAL
  Filled 2019-11-25 (×3): qty 1

## 2019-11-25 MED ORDER — POLYETHYLENE GLYCOL 3350 17 G PO PACK
17.0000 g | PACK | Freq: Every day | ORAL | Status: DC
  Administered 2019-11-25 – 2019-11-26 (×2): 17 g via ORAL
  Filled 2019-11-25 (×2): qty 1

## 2019-11-25 NOTE — Progress Notes (Signed)
Subjective: Patient reports tolerating PO, + flatus and no problems voiding  C/o constipation. Started on Miralax.  Notes lightheadedness on initial rising then resolves.    Objective: I have reviewed patient's vital signs.  vital signs. Vitals:   11/24/19 2214 11/25/19 0456  BP: (!) 94/57 102/62  Pulse: 100 100  Resp: 18 18  Temp: 98.6 F (37 C) (!) 100.5 F (38.1 C)  SpO2: 95% 95%    I/O last 3 completed shifts: In: 2493.7 [P.O.:1065; I.V.:1000; IV Piggyback:428.7] Out: 1350 [Urine:1350] Total I/O In: 180 [P.O.:180] Out: -   Lab Results  Component Value Date   WBC 12.0 (H) 11/24/2019   HGB 7.8 (L) 11/24/2019   HCT 24.7 (L) 11/24/2019   MCV 84.3 11/24/2019   PLT 303 11/24/2019   No results found for: CREATININE  EXAM General: alert Resp: clear to auscultation bilaterally Cardio: regular rate and rhythm, S1, S2 normal, no murmur, click, rub or gallop GI:  soft  (+) BS nondistended . incision: intact/dry . brown stain Extremities: no edema, redness or tenderness in the calves or thighs Vaginal Bleeding:  just changed pad  Assessment: s/p Procedure(s): ABDOMINAL MYOMECTOMY:  anemia asx. On oral iron s/p IV iron  Plan: watch Temp   If remains afebrile , then anticipate home in am Cont ambulate Disc use of incentive spirometer, encourage deep breath and cough q 2hr while awake Reviewed discharge instructions and removal of dressing    LOS: 2 days    Marvene Staff, MD 11/25/2019 11:37 AM    11/25/2019, 11:37 AM

## 2019-11-26 LAB — COMPREHENSIVE METABOLIC PANEL
ALT: 13 U/L (ref 0–44)
AST: 23 U/L (ref 15–41)
Albumin: 3.1 g/dL — ABNORMAL LOW (ref 3.5–5.0)
Alkaline Phosphatase: 33 U/L — ABNORMAL LOW (ref 38–126)
Anion gap: 8 (ref 5–15)
BUN: 6 mg/dL (ref 6–20)
CO2: 27 mmol/L (ref 22–32)
Calcium: 8.7 mg/dL — ABNORMAL LOW (ref 8.9–10.3)
Chloride: 104 mmol/L (ref 98–111)
Creatinine, Ser: 0.69 mg/dL (ref 0.44–1.00)
GFR, Estimated: >60 mL/min (ref >60)
Glucose, Bld: 98 mg/dL (ref 70–99)
Potassium: 3.7 mmol/L (ref 3.5–5.1)
Sodium: 139 mmol/L (ref 135–145)
Total Bilirubin: 0.9 mg/dL (ref 0.3–1.2)
Total Protein: 5.9 g/dL — ABNORMAL LOW (ref 6.5–8.1)

## 2019-11-26 LAB — CBC
HCT: 23.4 % — ABNORMAL LOW (ref 36.0–46.0)
Hemoglobin: 7.5 g/dL — ABNORMAL LOW (ref 12.0–15.0)
MCH: 27.4 pg (ref 26.0–34.0)
MCHC: 32.1 g/dL (ref 30.0–36.0)
MCV: 85.4 fL (ref 80.0–100.0)
Platelets: 295 10*3/uL (ref 150–400)
RBC: 2.74 MIL/uL — ABNORMAL LOW (ref 3.87–5.11)
RDW: 16.1 % — ABNORMAL HIGH (ref 11.5–15.5)
WBC: 8.6 10*3/uL (ref 4.0–10.5)
nRBC: 0.2 % (ref 0.0–0.2)

## 2019-11-26 LAB — SURGICAL PATHOLOGY

## 2019-11-26 MED ORDER — OXYCODONE HCL 5 MG PO TABS
5.0000 mg | ORAL_TABLET | ORAL | 0 refills | Status: AC | PRN
Start: 2019-11-26 — End: 2019-12-03

## 2019-11-26 MED ORDER — POLYSACCHARIDE IRON COMPLEX 150 MG PO CAPS: 150.0000 mg | ORAL_CAPSULE | Freq: Every day | ORAL | 11 refills | Status: AC

## 2019-11-26 MED ORDER — DOCUSATE SODIUM 100 MG PO CAPS: 100.0000 mg | ORAL_CAPSULE | Freq: Two times a day (BID) | ORAL | 0 refills | Status: AC

## 2019-11-26 NOTE — Progress Notes (Signed)
Subjective: Patient reports vomiting, incisional pain, tolerating PO, + flatus, and no problems voiding had vomited last night. Went too long without pain med  And had incisional pain. Note was able to eat this am. Feels alright to go home. Per pt , " nothing is being done here that she can't do at home".    Objective: I have reviewed patient's vital signs.  vital signs and labs. Vitals:   11/25/19 2134 11/26/19 0449  BP: 107/69 108/64  Pulse: 98 84  Resp: 18 16  Temp: 98.5 F (36.9 C) 97.6 F (36.4 C)  SpO2: 91% 99%   I/O last 3 completed shifts: In: 784 [P.O.:784] Out: 200 [Emesis/NG output:200] No intake/output data recorded.  Lab Results  Component Value Date   WBC 12.0 (H) 11/24/2019   HGB 7.8 (L) 11/24/2019   HCT 24.7 (L) 11/24/2019   MCV 84.3 11/24/2019   PLT 303 11/24/2019   No results found for: CREATININE  EXAM General: alert, cooperative, and no distress Resp: clear to auscultation bilaterally Cardio: regular rate and rhythm, S1, S2 normal, no murmur, click, rub or gallop GI: soft, non-tender; bowel sounds normal; no masses,  no organomegaly and incision: dry, intact, and brown stain Extremities: no edema, redness or tenderness in the calves or thighs Vaginal Bleeding: minimal  Assessment: s/p Procedure(s): ABDOMINAL MYOMECTOMY: stable, progressing well, and tolerating diet Anemia related to IDA and acute blood loss Plan: Discontinue IV fluids Discharge home Will check CBC, CMP( due to vomiting) prior to discharge D/c home if labs are stable D/c instructions reviewed Script : percocet  LOS: 3 days  Addendum: stable Hgb. Nl CMP  Marvene Staff, MD 11/26/2019 9:33 AM    11/26/2019, 9:33 AM

## 2019-11-26 NOTE — Discharge Summary (Signed)
Physician Discharge Summary  Patient ID: Alicia Hawkins MRN: [id number] DOB/AGE: 1990/12/12 29 y.o.  Admit date: 11/23/2019 Discharge date: 11/26/2019  Admission Diagnoses: symptomatic uterine fibroids  Discharge Diagnoses: symptomatic uterine fibroids( IM/SS/SM fibroids, IDA Anemia due to acute blood loss Principal Problem:   Uterine fibroid Active Problems:   IDA (iron deficiency anemia)   Hydronephrosis, right   Fibroids   S/P myomectomy   Discharged Condition: stable  Hospital Course: pt underwent exp lap, myomectomy. Postoperative  course notable for T100.5 on POD #2. Pt had episode of n/v on POD #0 and POD #2 Although pt was able to tol reg diet, passed flatus. She was given IV iron due to acute blood loss exacerbated her underlying iron deficiency Consults: None  Significant Diagnostic Studies: labs:  CBC Latest Ref Rng & Units 11/26/2019 11/24/2019 11/16/2019  WBC 4.0 - 10.5 K/uL 8.6 12.0(H) 3.2(L)  Hemoglobin 12.0 - 15.0 g/dL 7.5(L) 7.8(L) 11.9(L)  Hematocrit 36 - 46 % 23.4(L) 24.7(L) 37.8  Platelets 150 - 400 K/uL 295 303 348     Treatments: exp lap, myomectomy, IV iron infusion  Discharge Exam: Blood pressure 108/64, pulse 84, temperature 97.6 F (36.4 C), temperature source Oral, resp. rate 16, height 5' (1.524 m), weight 58.1 kg, SpO2 99 %. General appearance: alert, cooperative, appears stated age, and no distress Resp: clear to auscultation bilaterally Cardio: regular rate and rhythm, S1, S2 normal, no murmur, click, rub or gallop GI: soft active BS nondistended Pelvic: deferred Extremities: no edema, redness or tenderness in the calves or thighs Incision/Wound:   Disposition: Discharge disposition: 01-Home or Self Care       Discharge Instructions     Call MD for:  persistant dizziness or light-headedness   Complete by: As directed    Call MD for:  persistant nausea and vomiting   Complete by: As directed    Call MD for:  redness,  tenderness, or signs of infection (pain, swelling, redness, odor or green/yellow discharge around incision site)   Complete by: As directed    Call MD for:  temperature >100.4   Complete by: As directed    Diet general   Complete by: As directed    Discharge instructions   Complete by: As directed    Call if temperature greater than equal to 100.4, nothing per vagina for 4-6 weeks or severe nausea vomiting, increased incisional pain , drainage or redness in the incision site, no straining with bowel movements, showers no bath   Increase activity slowly   Complete by: As directed    Remove dressing in 48 hours   Complete by: As directed    Honeycomb dressing only        Follow-up Information     Servando Salina, MD Follow up in 4 week(s).   Specialty: Obstetrics and Gynecology Contact information: [address] [place] [address] [phone]                 Signed: Marvene Staff 11/26/2019, 9:43 AM

## 2019-11-26 NOTE — Discharge Instructions (Signed)
Call if temp 100.4 or higher, nothing per vagina for 4 wk, call if soaking a pad every hour or more frequently. Severe nausea, vomiting abdominal pain, showers no bath. Call if increased incisional pain or drainage, redness from incision site

## 2019-11-26 NOTE — Progress Notes (Signed)
Alicia Hawkins to be D/C'd per MD order. Discussed with the patient and all questions fully answered.  VSS, Skin clean, dry, and intact without evidence of skin break down, no evidence of skin tears noted.  IV catheter discontinued intact. Site without signs and symptoms of complications. Dressing and pressure applied.  An After Visit Summary was printed and given to the patient. Patient informed where to pick up prescriptions.  D/C education completed with patient including follow up instructions, medication list, with other d/c instructions as indicated by MD - patient able to verbalize understanding, all questions fully answered.  Patient instructed to return to ED, call 911, or call MD for any changes in condition.  Patient to be escorted via [place], and D/C home via private auto.

## 2019-11-26 NOTE — Op Note (Signed)
NAMEJASEMINE, Alicia Hawkins MEDICAL RECORD [id number] ACCOUNT [id number] DATE OF BIRTH:1990/09/04 FACILITY: [facility] LOCATION: [facility] PHYSICIAN:Maizy Davanzo A. Halcyon Heck, MD  OPERATIVE REPORT  DATE OF PROCEDURE:  11/23/2019  PREOPERATIVE DIAGNOSES:  Menorrhagia with regular cycle, uterine fibroids, iron deficiency anemia.  PROCEDURE:  Exploratory laparotomy, multiple myomectomy.  POSTOPERATIVE DIAGNOSES:  Intramural subserosal and submucosal fibroids, iron deficiency anemia, menorrhagia with regular cycles.  ANESTHESIA:  General.  SURGEON:  Servando Salina, MD  ASSISTANT:  Gaylord Shih, RNFA  DESCRIPTION OF PROCEDURE:  Under adequate general anesthesia, the patient was placed in the supine position.  She was sterilely prepped and draped in usual fashion.  Examination had revealed a uterus that was 2 fingerbreadths above the umbilicus with a  mobile left palpable fundal fibroid.  Marcaine 0.25% was injected along the planned Pfannenstiel skin incision site.  Pfannenstiel skin incision was then made, carried down to the rectus fascia.  The rectus fascia was opened transversely.  Rectus fascia  was bluntly and sharply dissected off the rectus muscle in a superior and inferior fashion.  The rectus muscle was split in the midline.  The parietal peritoneum was entered sharply and extended.  The abdomen was explored superiorly.  Normal liver edge,  normal palpable kidneys with a large fibroid noted and pedunculated off to the left.  Using the tenaculum, that large, about 8 cm fibroid was brought up through the incision; however, the remaining uterus was still inside.  With additional  single tooth tenaculum and countertraction with the abdominal wall, the uterus was finally exteriorized.  Normal tubes and ovaries were noted bilaterally.  The patient had anterior pedunculated fibroid in the midline.  There was a left lateral to anterior intramural subserosal fibroid 3 in succession transversely, 5-6  cm fibroids adjacent to each other with a smaller fibroid below that posteriorly.  The procedure was started with the larger left anterior fibroid with injecting a dilute solution of  Pitressin in the serosal surface in a vertical fashion.  A vertical incision was then made and carried underneath the pedunculated fibroid, which was superior. That fibroid was then enucleated from its base and the incision was carried down further vertically in order to  reach the left anterior lateral fibroid.  With the capsule being reached, that large fibroid was enucleated from its base.  A defect was used to then palpate for additional fibroids which were then removed.  The cavity was not entered at that point.   Posteriorly with the location of the previous tenaculum sites, decision was made to do an oblique transverse incision along the upper fibroids after dilute solution of Pitressin was again injected.  This was used to connect the prior superficial defects caused by the  tenaculum being placed.  Two subserosal fibroids was initially removed and  fibroid below that 3 other fibroids were removed and when palpating the deeper portion of the defect, additional fibroids were then removed, at which time it was then noted that the endometrial cavity  was entered superiorly and posteriorly.  Through that incision,inside the endometrial cavity was palpated with 2 fibroids were noted submucosal and some small grain like size fibroids.  With the incision in the front not being closed as yet and with the use of the  tenaculum, the submucosal fibroids were removed.  The right superior posterior fibroid was also removed.  Once all of these fibroids that was palpable and further anteriorly were all removed, the defects were closed with interrupted figure-of-eight sutures of 0 Vicryl.  The 3-0  Vicryl was used to cover over the roof of the opened endometrium posteriorly and the remaining closure was done with interrupted 0 Vicryl  sutures until the serosal surface was reached, at which time 2-0 Monocryl sutures was  then placed in a baseball stitch.  Anteriorly, there was some area that was appeared to be accumulating blood at which time horizontal mattress  sutures of the Monocryl was utilized with good hemostasis noted.  The abdomen was irrigated and suctioned of  debris.  The uterus was then returned back to the abdomen.  The abdomen was irrigated and suctioned.  Interceed was placed overlying the incisions anteriorly and posteriorly.  2-0 Vicryl was then used to close the parietal peritoneum, 0 Vicryl x2 to closed  the rectus fascia.  Next, the subcutaneous fat was closed with interrupted 2-0 plain sutures and the skin approximated with 4-0 Vicryl subcuticular closure.  Steri-Strips and benzoin was placed.  SPECIMEN:  18 myomas weighing 1186 grams sent to pathology.  ESTIMATED BLOOD LOSS:  200 mL.  INTRAOPERATIVE FLUIDS:  2 liters crystalloid and 1 bottle of Hespan.  URINE OUTPUT:  400 mL.  COUNTS:  Sponge and instrument counts x2 was correct.  COMPLICATIONS:  None.  DISPOSITION:  The patient tolerated the procedure well and was transferred to recovery room in stable condition.  She will need a cesarean section for future deliveries.  HN/NUANCE  D:11/23/2019 T:11/24/2019 JOB:[id number]

## 2020-11-27 ENCOUNTER — Emergency Department: Payer: BC Managed Care – PPO

## 2020-11-27 ENCOUNTER — Other Ambulatory Visit: Payer: Self-pay

## 2020-11-27 ENCOUNTER — Encounter: Payer: Self-pay | Admitting: Emergency Medicine

## 2020-11-27 ENCOUNTER — Emergency Department
Admission: EM | Admit: 2020-11-27 | Discharge: 2020-11-27 | Disposition: A | Discharge status: Home / Self Care | Payer: BC Managed Care – PPO | Attending: Emergency Medicine | Admitting: Emergency Medicine

## 2020-11-27 DIAGNOSIS — J45909 Unspecified asthma, uncomplicated: Secondary | ICD-10-CM | POA: Insufficient documentation

## 2020-11-27 DIAGNOSIS — R1013 Epigastric pain: Secondary | ICD-10-CM | POA: Insufficient documentation

## 2020-11-27 DIAGNOSIS — R197 Diarrhea, unspecified: Secondary | ICD-10-CM | POA: Diagnosis not present

## 2020-11-27 DIAGNOSIS — R110 Nausea: Secondary | ICD-10-CM | POA: Diagnosis not present

## 2020-11-27 DIAGNOSIS — Z20822 Contact with and (suspected) exposure to covid-19: Secondary | ICD-10-CM | POA: Diagnosis not present

## 2020-11-27 LAB — CBC
HCT: 39.5 % (ref 36.0–46.0)
Hemoglobin: 13.3 g/dL (ref 12.0–15.0)
MCH: 28.7 pg (ref 26.0–34.0)
MCHC: 33.7 g/dL (ref 30.0–36.0)
MCV: 85.3 fL (ref 80.0–100.0)
Platelets: 344 10*3/uL (ref 150–400)
RBC: 4.63 MIL/uL (ref 3.87–5.11)
RDW: 12.5 % (ref 11.5–15.5)
WBC: 2.9 10*3/uL — ABNORMAL LOW (ref 4.0–10.5)
nRBC: 0 % (ref 0.0–0.2)

## 2020-11-27 LAB — RESP PANEL BY RT-PCR (FLU A&B, COVID) ARPGX2
Influenza A by PCR: NEGATIVE
Influenza B by PCR: NEGATIVE
SARS Coronavirus 2 by RT PCR: NEGATIVE

## 2020-11-27 LAB — URINALYSIS, ROUTINE W REFLEX MICROSCOPIC
Bilirubin Urine: NEGATIVE
Glucose, UA: NEGATIVE mg/dL
Ketones, ur: NEGATIVE mg/dL
Leukocytes,Ua: NEGATIVE
Nitrite: NEGATIVE
Protein, ur: NEGATIVE mg/dL
Specific Gravity, Urine: 1.01 (ref 1.005–1.030)
pH: 6 (ref 5.0–8.0)

## 2020-11-27 LAB — COMPREHENSIVE METABOLIC PANEL
ALT: 16 U/L (ref 0–44)
AST: 25 U/L (ref 15–41)
Albumin: 4.5 g/dL (ref 3.5–5.0)
Alkaline Phosphatase: 42 U/L (ref 38–126)
Anion gap: 6 (ref 5–15)
BUN: 14 mg/dL (ref 6–20)
CO2: 27 mmol/L (ref 22–32)
Calcium: 9.4 mg/dL (ref 8.9–10.3)
Chloride: 104 mmol/L (ref 98–111)
Creatinine, Ser: 0.68 mg/dL (ref 0.44–1.00)
GFR, Estimated: >60 mL/min (ref >60)
Glucose, Bld: 112 mg/dL — ABNORMAL HIGH (ref 70–99)
Potassium: 4.2 mmol/L (ref 3.5–5.1)
Sodium: 137 mmol/L (ref 135–145)
Total Bilirubin: 1.1 mg/dL (ref 0.3–1.2)
Total Protein: 8 g/dL (ref 6.5–8.1)

## 2020-11-27 LAB — TROPONIN I (HIGH SENSITIVITY): Troponin I (High Sensitivity): <2 ng/L (ref <18)

## 2020-11-27 LAB — POC URINE PREG, ED: Preg Test, Ur: NEGATIVE

## 2020-11-27 LAB — LIPASE, BLOOD: Lipase: 28 U/L (ref 11–51)

## 2020-11-27 NOTE — ED Notes (Signed)
Pt states she feels well enough to walk to lobby. Pt with steady gait.

## 2020-11-27 NOTE — Discharge Instructions (Signed)
Please take Tylenol and ibuprofen/Advil for your pain.  It is safe to take them together, or to alternate them every few hours.  Take up to 1000mg of Tylenol at a time, up to 4 times per day.  Do not take more than 4000 mg of Tylenol in 24 hours.  For ibuprofen, take 400-600 mg, 4-5 times per day. ° ° °

## 2020-11-27 NOTE — ED Triage Notes (Signed)
Pt comes into the ED via POV c/o epigastric pain that started yesterday.  Pt states that it has gotten worse today and she thinks it may be related to eating [employer].  Pt has mild nausea, but denies any diarrhea.  Pt in NAD at this time with even and unlabored respirations.

## 2020-11-27 NOTE — ED Provider Notes (Addendum)
[facility] Emergency Department Provider Note ____________________________________________   Event Date/Time   First MD Initiated Contact with Patient 11/27/20 [phone]     (approximate)  I have reviewed the triage vital signs and the nursing notes.  HISTORY  Chief Complaint Abdominal Pain   HPI Alicia Hawkins is a 30 y.o. femalewho presents to the ED for evaluation of abd pain.   Chart review indicates fibroid uterus s/p myomectomy.   Patient presents to the ED for the ration of a couple days of intermittent epigastric discomfort and nausea.  She reports eating [employer] Tuesday night, which is unusual for her.  No postprandial pain then, but developed intermittent abdominal discomfort throughout the day yesterday and this morning.  She reports slightly looser stools and up to 4-5 bowel movements per day, as opposed to her typical 1 formed bowel movement per day.  She reports nausea without emesis, fever, syncope.  He reports epigastric discomfort at 1 point radiated upwards towards her chest, and so she presents to the ED for evaluation.  Reports no pain right now feeling okay, just concerned about possible pathology.  Past Medical History:  Diagnosis Date   Asthma    Heart murmur    as a child   Hemorrhoids    Hydronephrosis, right 11/16/2019   IDA (iron deficiency anemia) 11/16/2019   Pneumonia    Uterine fibroid 11/16/2019    Patient Active Problem List   Diagnosis Date Noted   Fibroids 11/23/2019   S/P myomectomy 11/23/2019   Uterine fibroid 11/16/2019   IDA (iron deficiency anemia) 11/16/2019   Hydronephrosis, right 11/16/2019    Past Surgical History:  Procedure Laterality Date   LIPOSUCTION  05/2018   Laser liposuction   MYOMECTOMY N/A 11/23/2019   Procedure: ABDOMINAL MYOMECTOMY;  Surgeon: Servando Salina, MD;  Location: [facility];  Service: Gynecology;  Laterality: N/A;  Tracie to RNFA confirmed on 10/25/19 CS    Prior to  Admission medications   Medication Sig Start Date End Date Taking? Authorizing Provider  albuterol (VENTOLIN HFA) 108 (90 Base) MCG/ACT inhaler Inhale 2 puffs into the lungs every 6 (six) hours as needed for wheezing or shortness of breath.    [provider]  ascorbic acid (VITAMIN C) 500 MG tablet Take 500 mg by mouth daily.    [provider]  clobetasol ointment (TEMOVATE) 0.62 % Apply 1 application topically 2 (two) times daily as needed (eczema).  11/02/19   [provider]  docusate sodium (COLACE) 100 MG capsule Take 1 capsule (100 mg total) by mouth 2 (two) times daily. 11/26/19   Servando Salina, MD  iron polysaccharides (NIFEREX) 150 MG capsule Take 1 capsule (150 mg total) by mouth daily. 11/26/19   Servando Salina, MD  levocetirizine (XYZAL) 5 MG tablet Take 5 mg by mouth daily as needed for allergies.    [provider]  mupirocin ointment (BACTROBAN) 2 % Apply 1 application topically 2 (two) times daily.    [provider]    Allergies Patient has no known allergies.  History reviewed. No pertinent family history.  Social History Social History   Tobacco Use   Smoking status: Never   Smokeless tobacco: Never  Vaping Use   Vaping Use: Never used  Substance Use Topics   Alcohol use: Yes    Comment: socially   Drug use: Never    Review of Systems  Constitutional: No fever/chills Eyes: No visual changes. ENT: No sore throat. Cardiovascular: Positive for chest pain.  Respiratory: Denies shortness of breath. Gastrointestinal: Positive for epigastric pain, nausea and loose stool.  No vomiting.   No constipation. Genitourinary: Negative for dysuria. Musculoskeletal: Negative for back pain. Skin: Negative for rash. Neurological: Negative for headaches, focal weakness or numbness.  ____________________________________________   PHYSICAL EXAM:  VITAL SIGNS: Vitals:   11/27/20 1100 11/27/20 1112  BP:  107/69   Pulse: 73 70  Resp: 18 16  Temp:    SpO2: 96% 97%     Constitutional: Alert and oriented. Well appearing and in no acute distress. Eyes: Conjunctivae are normal. PERRL. EOMI. Head: Atraumatic. Nose: No congestion/rhinnorhea. Mouth/Throat: Mucous membranes are moist.  Oropharynx non-erythematous. Neck: No stridor. No cervical spine tenderness to palpation. Cardiovascular: Normal rate, regular rhythm. Grossly normal heart sounds.  Good peripheral circulation. Respiratory: Normal respiratory effort.  No retractions. Lungs CTAB. Gastrointestinal: Soft , nondistended, nontender to palpation. No CVA tenderness. Benign exam without tenderness or peritoneal features. Musculoskeletal: No lower extremity tenderness nor edema.  No joint effusions. No signs of acute trauma. Neurologic:  Normal speech and language. No gross focal neurologic deficits are appreciated. No gait instability noted. Skin:  Skin is warm, dry and intact. No rash noted. Psychiatric: Mood and affect are normal. Speech and behavior are normal.  ____________________________________________   LABS (all labs ordered are listed, but only abnormal results are displayed)  Labs Reviewed  COMPREHENSIVE METABOLIC PANEL - Abnormal; Notable for the following components:      Result Value   Glucose, Bld 112 (*)    All other components within normal limits  CBC - Abnormal; Notable for the following components:   WBC 2.9 (*)    All other components within normal limits  URINALYSIS, ROUTINE W REFLEX MICROSCOPIC - Abnormal; Notable for the following components:   Color, Urine YELLOW (*)    APPearance HAZY (*)    Hgb urine dipstick MODERATE (*)    Bacteria, UA RARE (*)    All other components within normal limits  RESP PANEL BY RT-PCR (FLU A&B, COVID) ARPGX2  LIPASE, BLOOD  POC URINE PREG, ED  TROPONIN I (HIGH SENSITIVITY)   ____________________________________________  12 Lead EKG  Sinus rhythm, rate of 63 bpm.  Normal  axis and intervals.  No evidence of acute ischemia. ____________________________________________  RADIOLOGY  ED MD interpretation:  CXR reviewed by me without evidence of acute cardiopulmonary pathology. RUQ ultrasound reviewed by me without stones or cholecystitis  Official radiology report(s): DG Chest 2 View  Result Date: 11/27/2020 CLINICAL DATA:  Chest and epigastric pain EXAM: CHEST - 2 VIEW COMPARISON:  11/02/2017 FINDINGS: The heart size and mediastinal contours are within normal limits. Both lungs are clear. The visualized skeletal structures are unremarkable. IMPRESSION: No active cardiopulmonary disease. Electronically Signed   By: Miachel Roux M.D.   On: 11/27/2020 10:13   US ABDOMEN LIMITED RUQ (LIVER/GB)  Result Date: 11/27/2020 CLINICAL DATA:  Epigastric pain EXAM: ULTRASOUND ABDOMEN LIMITED RIGHT UPPER QUADRANT COMPARISON:  None. FINDINGS: Gallbladder: No gallstones or wall thickening visualized. No sonographic Murphy sign noted by sonographer. Common bile duct: Diameter: 2 mm Liver: Two small cysts are incidentally noted. Within normal limits in parenchymal echogenicity. Portal vein is patent on color Doppler imaging with normal direction of blood flow towards the liver. Other: None. IMPRESSION: Unremarkable right upper quadrant ultrasound. Electronically Signed   By: Macy Mis M.D.   On: 11/27/2020 09:43    ____________________________________________   PROCEDURES and INTERVENTIONS  Procedure(s) performed (including Critical Care):  Procedures  Medications -  No data to display  ____________________________________________   MDM / ED COURSE   30 year old female with history of previous myomectomy for fibroids presents to the ED with intermittent epigastric discomfort, without evidence of acute pathology, and amenable to outpatient management.  Normal vitals and examination.  She looks well, is asymptomatic here in the ED and has a benign examination.  No  abdominal tenderness.  No evidence of cholelithiasis or cholecystitis.  No evidence of CAP or PTX.  No evidence of COVID or flu.  No signs of UTI and her troponin is low.  Remains asymptomatic.  We discussed OTC medications at home and return precautions for the ED.  Clinical Course as of 11/27/20 1505  Thu Nov 27, 2020  1118 Reassessed.  Remains asymptomatic.  Remains with a benign abdominal examination.  We discussed benign work-up and recommendations for outpatient management.  We discussed return precautions. [DS]    Clinical Course User Index [DS] Vladimir Crofts, MD    ____________________________________________   FINAL CLINICAL IMPRESSION(S) / ED DIAGNOSES  Final diagnoses:  Epigastric abdominal pain     ED Discharge Orders     None        Sander Remedios Tamala Julian   Note:  This document was prepared using Dragon voice recognition software and may include unintentional dictation errors.    Vladimir Crofts, MD 11/27/20 1248    Vladimir Crofts, MD 11/27/20 [phone]

## 2022-11-09 IMAGING — CR DG CHEST 2V
1 series · 2 of 2 positions shown · non-contrast
Comparison: 11/02/2017

CLINICAL DATA: Chest and epigastric pain

EXAM:
CHEST - 2 VIEW

[Series 1: dg chest 2 view · 0.14mm/px · 2 of 2 slices shown]
[im 1/2]
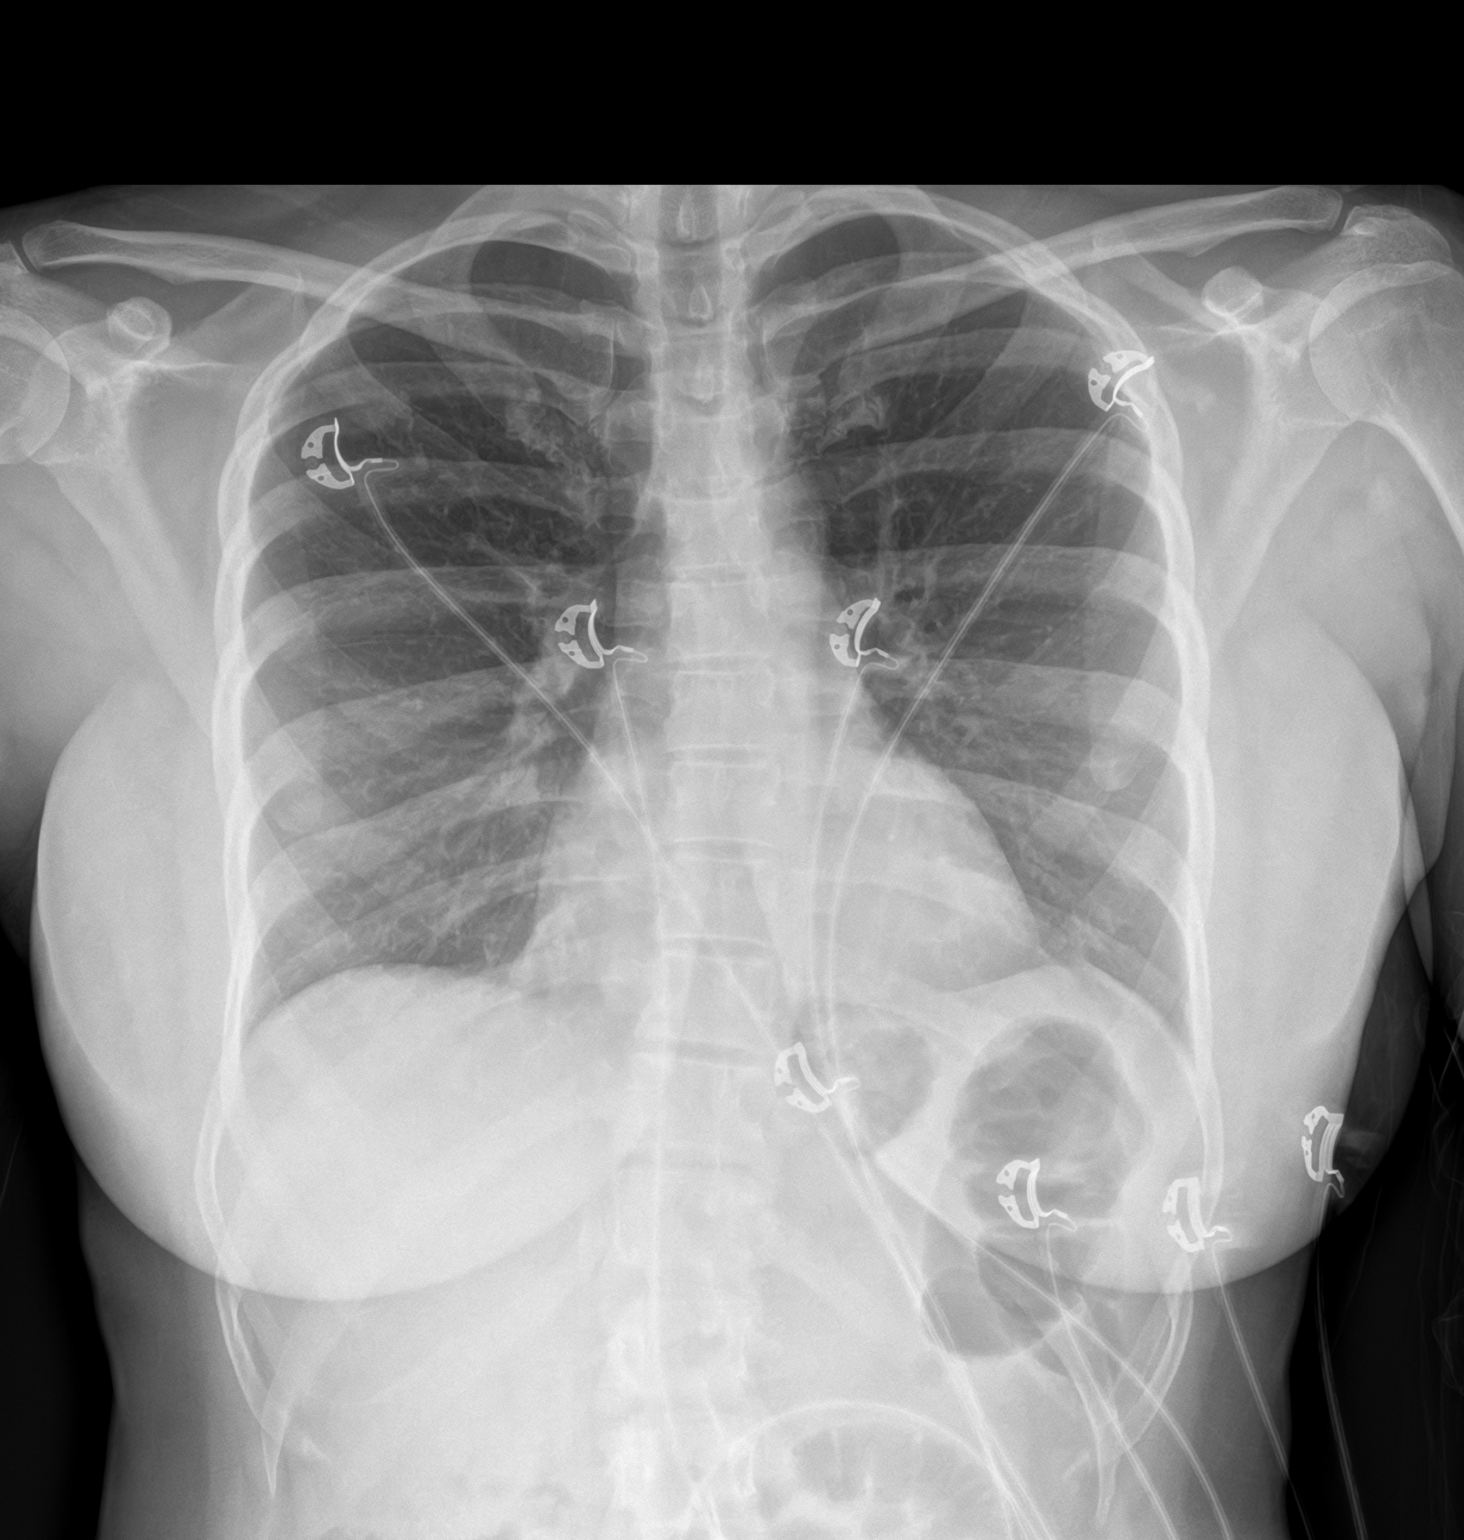
[im 2/2]
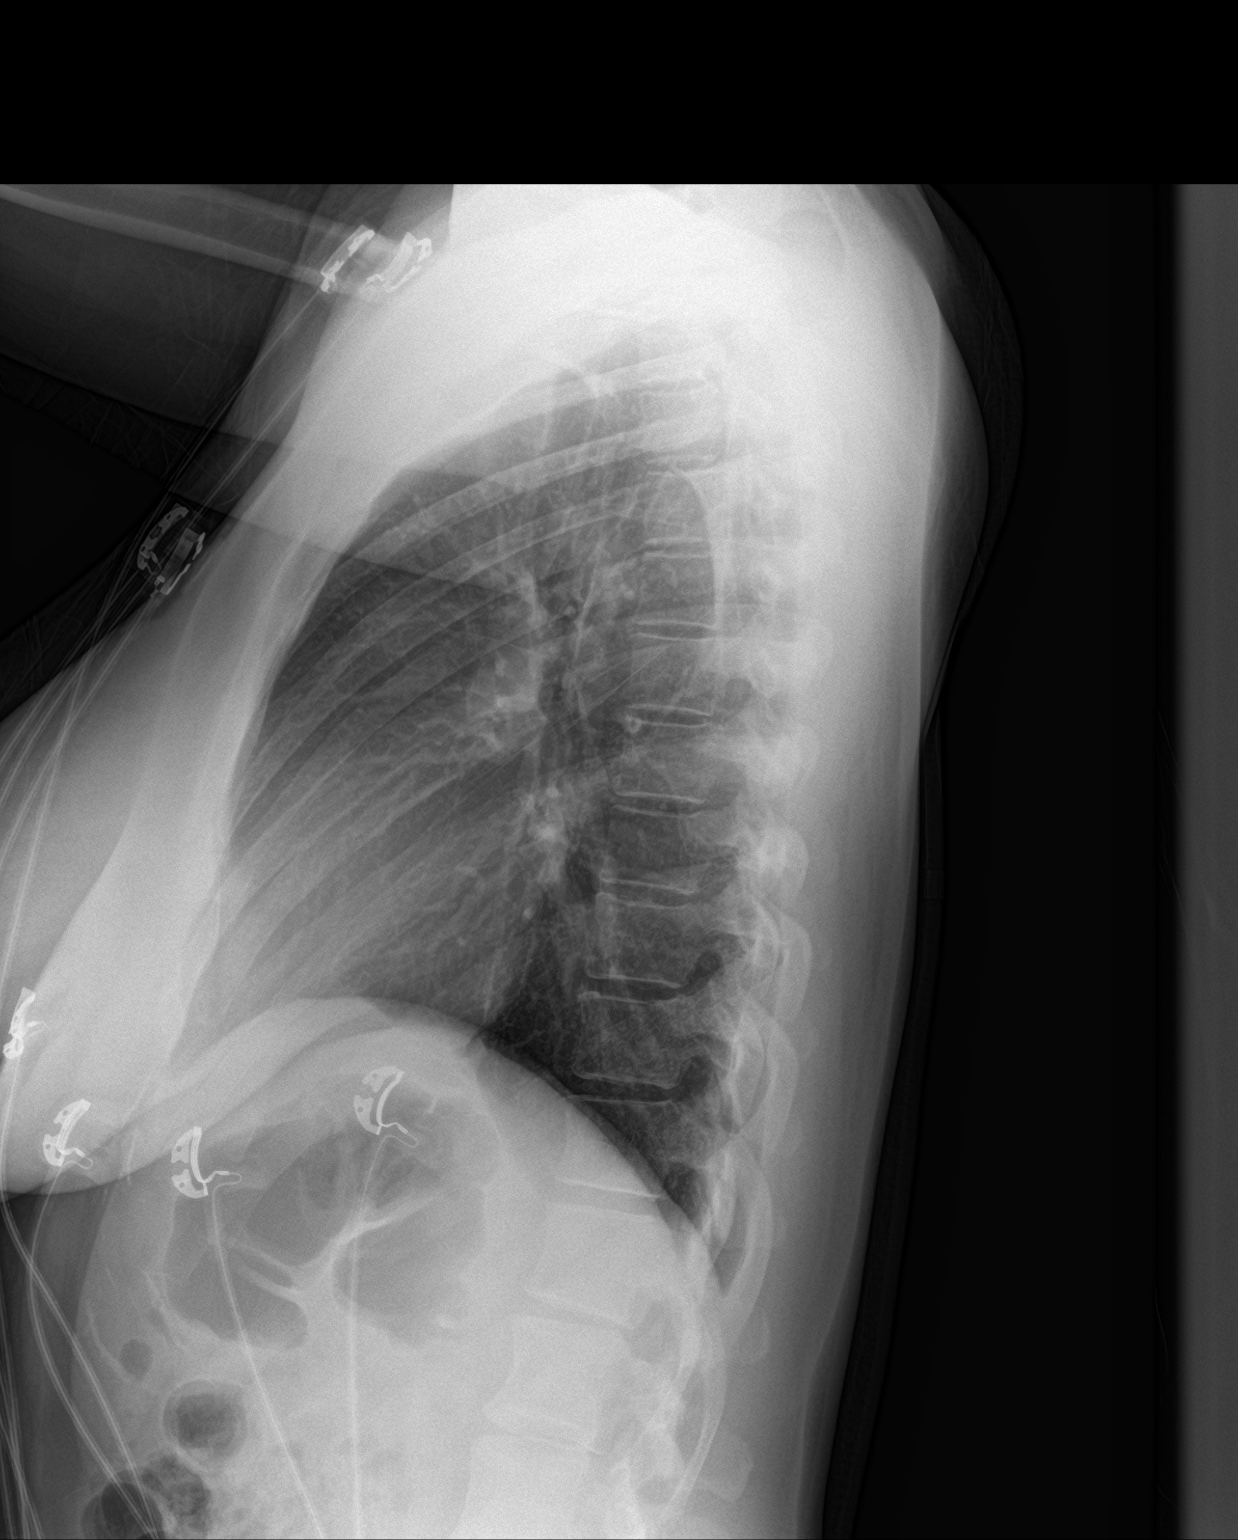

[2 of 2 positions shown; findings below may reference images not displayed]

FINDINGS: The heart size and mediastinal contours are within normal limits.
Both lungs are clear. The visualized skeletal structures are
unremarkable.
IMPRESSION: No active cardiopulmonary disease.

## 2022-11-09 IMAGING — US US ABDOMEN LIMITED
1 series · 14 of 25 positions shown · non-contrast
Comparison: None.

CLINICAL DATA: Epigastric pain

EXAM:
ULTRASOUND ABDOMEN LIMITED RIGHT UPPER QUADRANT

[Series 1: us abdomen limited ruq (liver/gb) · 14 of 52 slices shown]
[im 1/52]
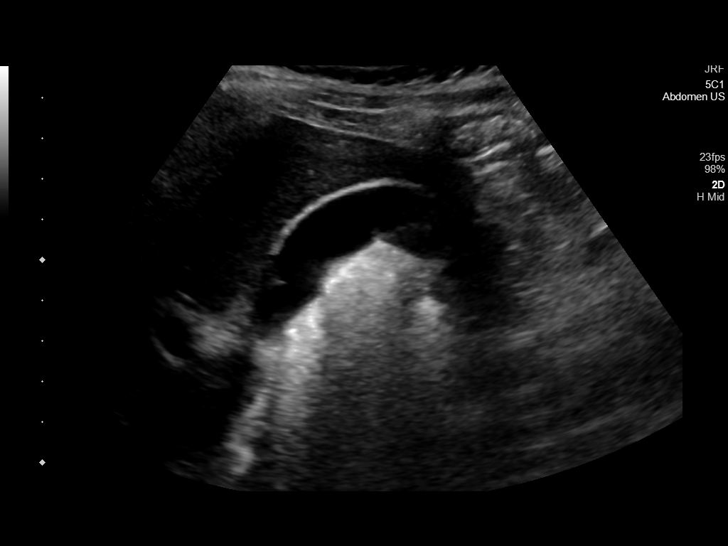
[im 5/52]
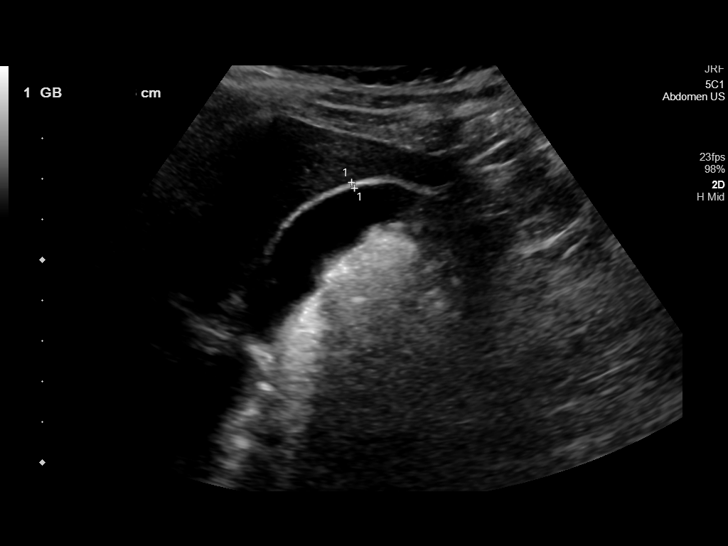
[im 9/52]
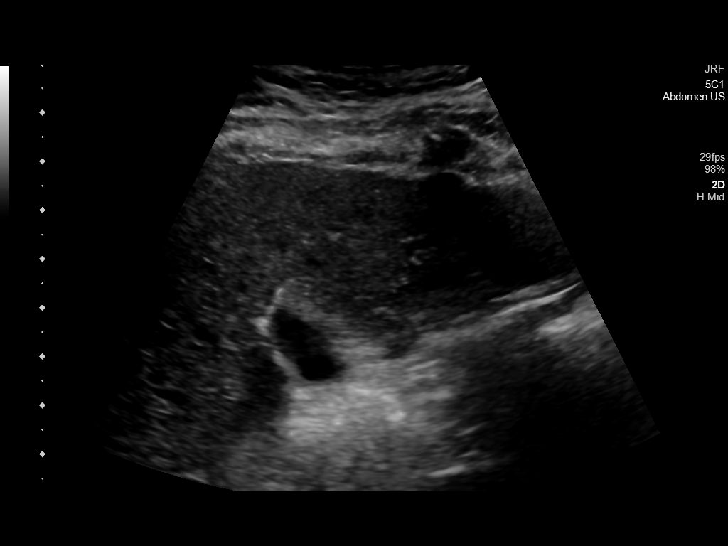
[im 13/52]
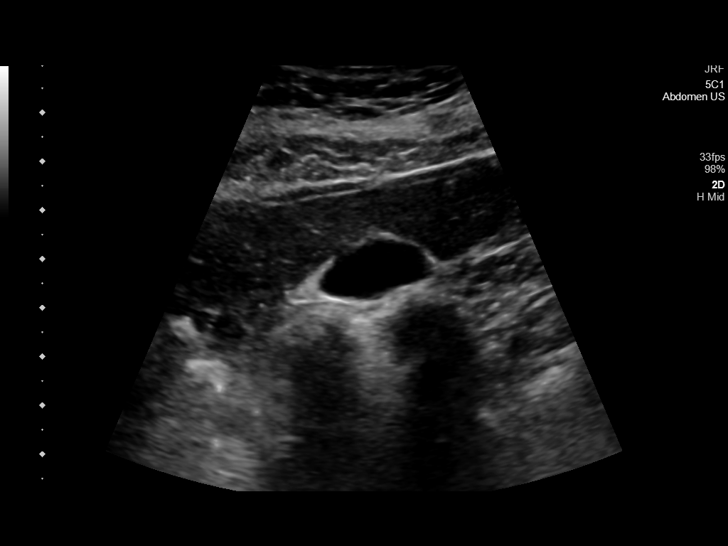
[im 18/52]
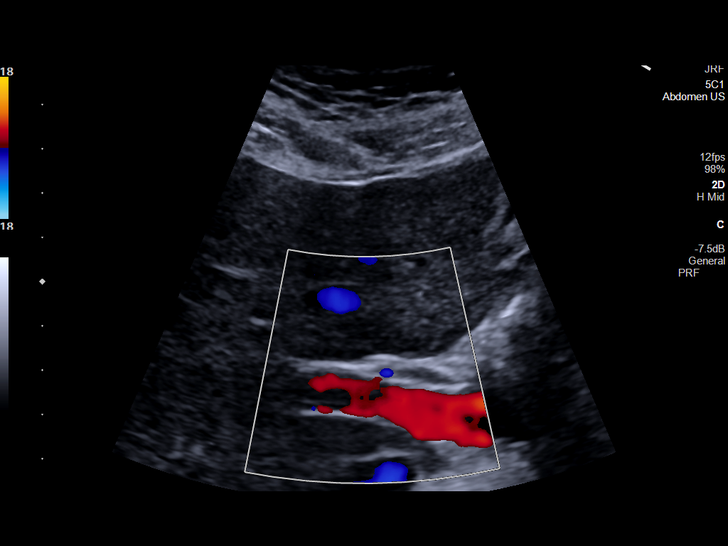
[im 20/52]
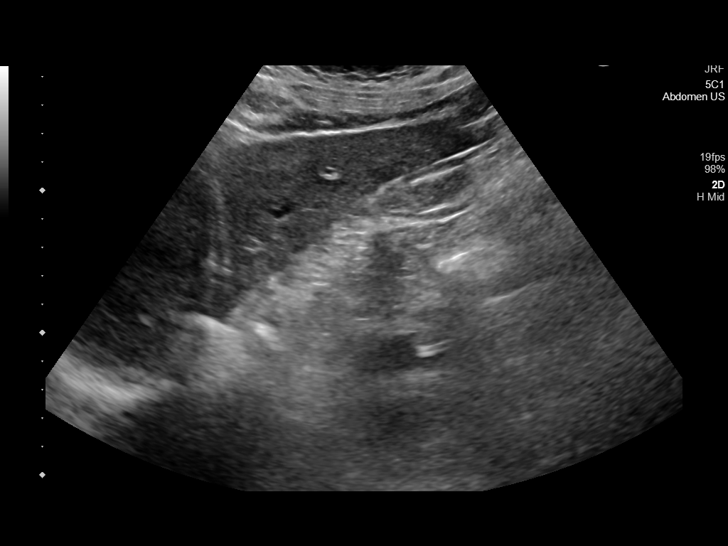
[im 24/52]
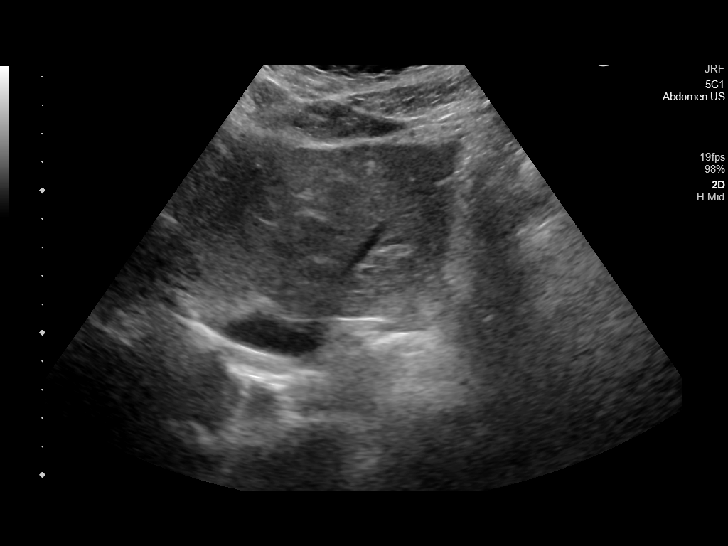
[im 28/52]
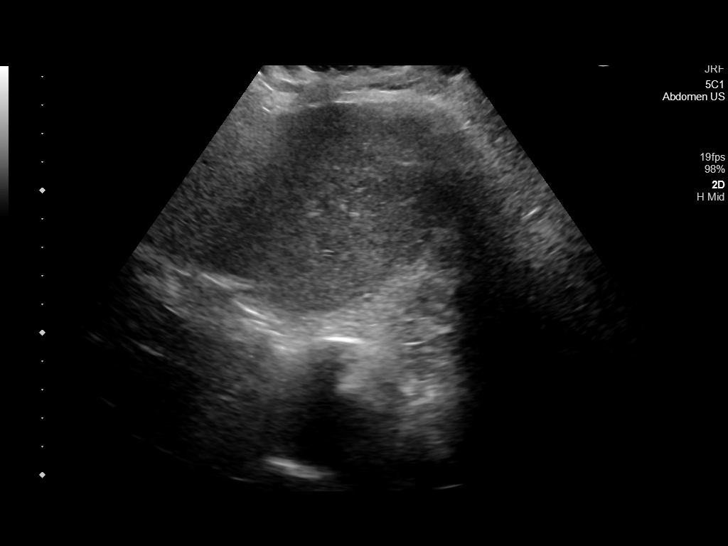
[im 32/52]
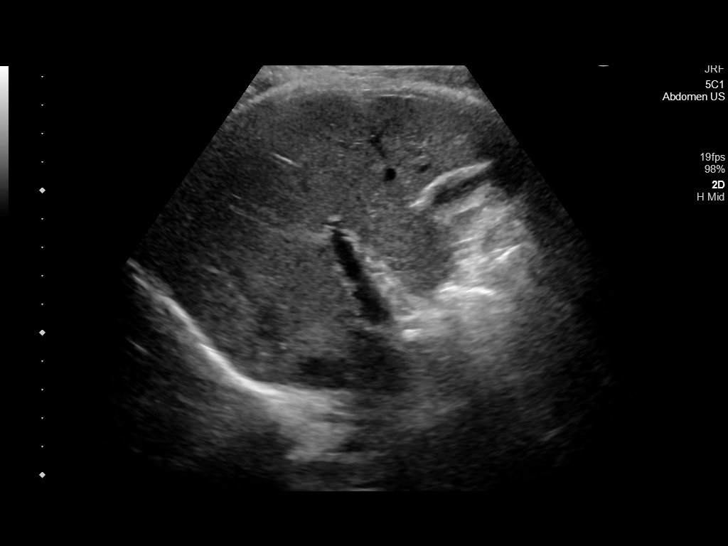
[im 35/52]
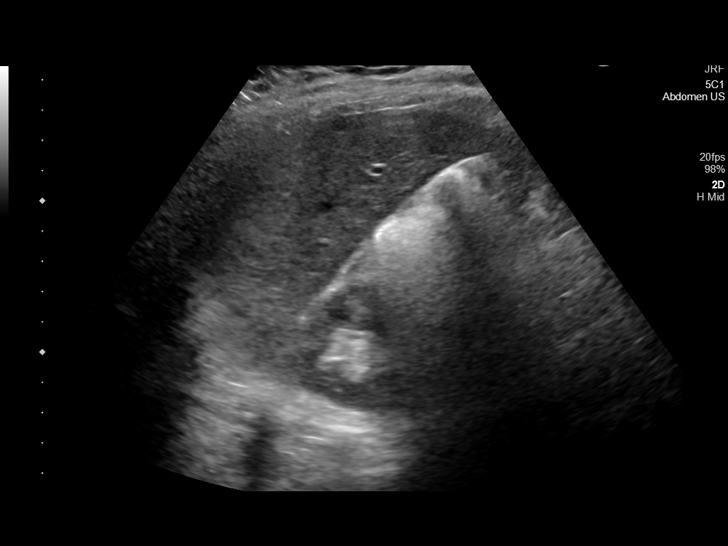
[im 39/52]
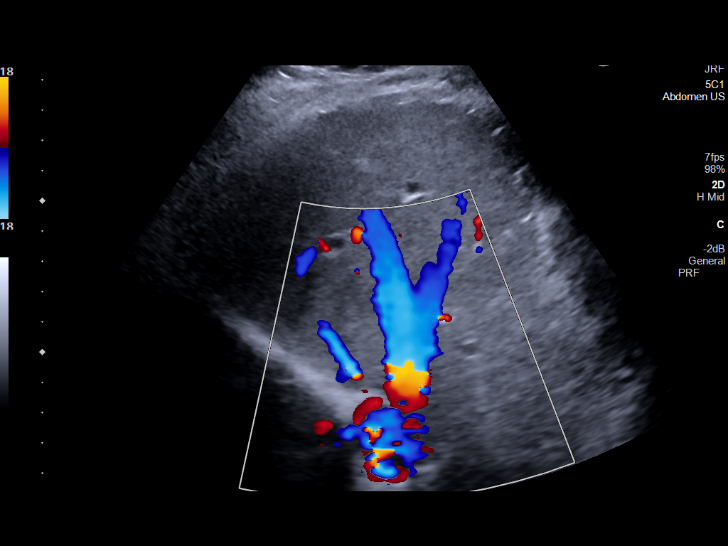
[im 43/52]
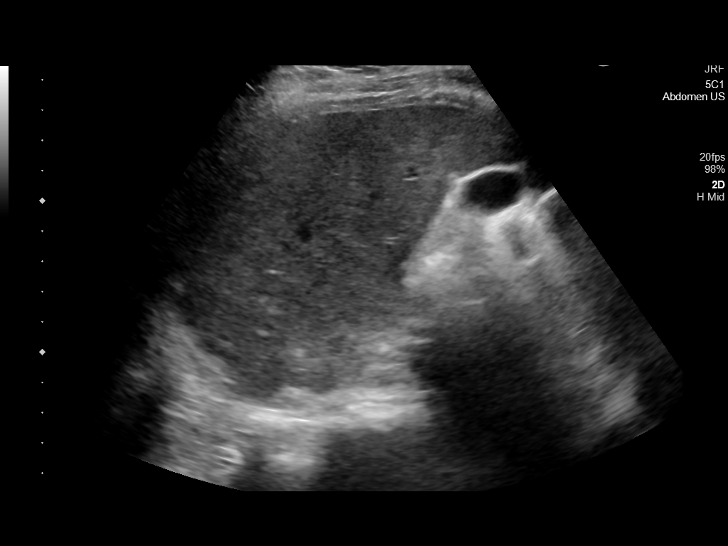
[im 47/52]
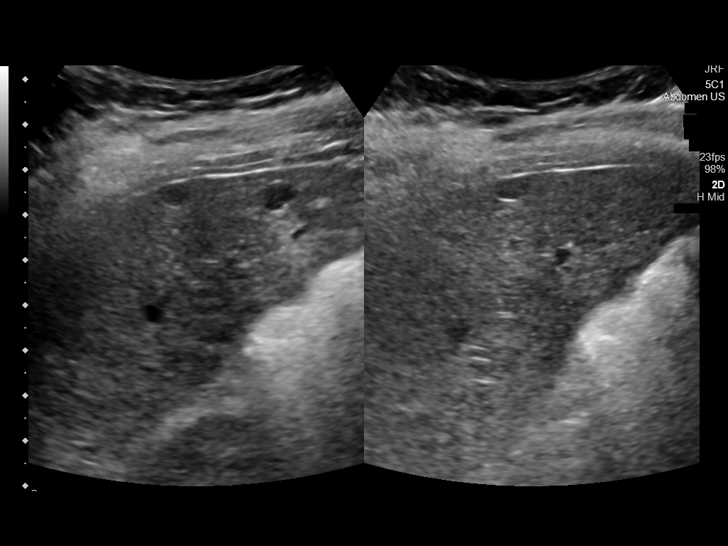
[im 52/52]
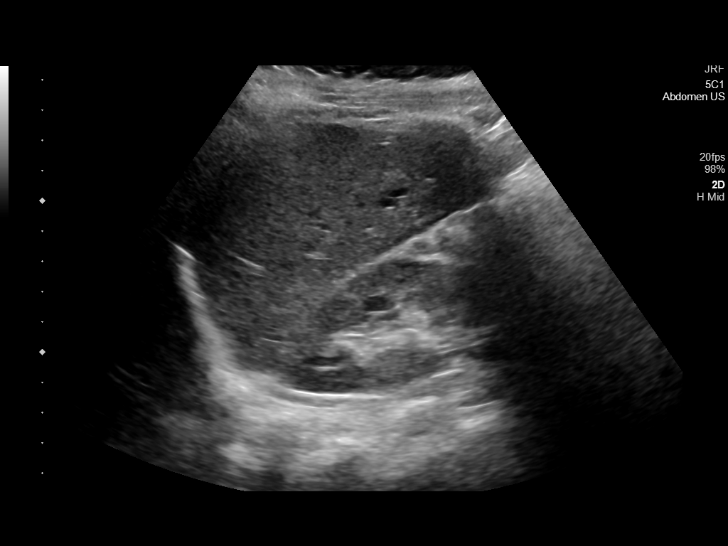

[14 of 25 positions shown; findings below may reference images not displayed]

FINDINGS: Gallbladder:

No gallstones or wall thickening visualized. No sonographic Murphy
sign noted by sonographer.

Common bile duct:

Diameter: 2 mm

Liver:

Two small cysts are incidentally noted. Within normal limits in
parenchymal echogenicity. Portal vein is patent on color Doppler
imaging with normal direction of blood flow towards the liver.

Other: None.
IMPRESSION: Unremarkable right upper quadrant ultrasound.
